# Patient Record
Sex: Male | Born: 1950
Health system: Southern US, Community
[De-identification: ages and names within clinical notes are randomized; demographics above are authoritative.]

## PROBLEM LIST (undated history)

## (undated) DIAGNOSIS — E785 Hyperlipidemia, unspecified: Secondary | ICD-10-CM

## (undated) DIAGNOSIS — Z8719 Personal history of other diseases of the digestive system: Secondary | ICD-10-CM

## (undated) DIAGNOSIS — I10 Essential (primary) hypertension: Secondary | ICD-10-CM

## (undated) DIAGNOSIS — N289 Disorder of kidney and ureter, unspecified: Secondary | ICD-10-CM

## (undated) DIAGNOSIS — I714 Abdominal aortic aneurysm, without rupture, unspecified: Secondary | ICD-10-CM

## (undated) DIAGNOSIS — Z8619 Personal history of other infectious and parasitic diseases: Secondary | ICD-10-CM

## (undated) DIAGNOSIS — Z87828 Personal history of other (healed) physical injury and trauma: Secondary | ICD-10-CM

## (undated) DIAGNOSIS — Z87442 Personal history of urinary calculi: Secondary | ICD-10-CM

## (undated) DIAGNOSIS — Z87891 Personal history of nicotine dependence: Secondary | ICD-10-CM

## (undated) HISTORY — DX: Disorder of kidney and ureter, unspecified: N28.9

## (undated) HISTORY — PX: BACK SURGERY: SHX140

## (undated) HISTORY — DX: Abdominal aortic aneurysm, without rupture: I71.4

## (undated) HISTORY — DX: Essential (primary) hypertension: I10

## (undated) HISTORY — DX: Abdominal aortic aneurysm, without rupture, unspecified: I71.40

## (undated) HISTORY — DX: Personal history of urinary calculi: Z87.442

## (undated) HISTORY — DX: Personal history of other infectious and parasitic diseases: Z86.19

## (undated) HISTORY — DX: Hyperlipidemia, unspecified: E78.5

## (undated) HISTORY — PX: STAPEDECTOMY: SHX2435

## (undated) HISTORY — DX: Personal history of other (healed) physical injury and trauma: Z87.828

## (undated) HISTORY — DX: Personal history of nicotine dependence: Z87.891

## (undated) HISTORY — DX: Personal history of other diseases of the digestive system: Z87.19

---

## 2002-06-03 ENCOUNTER — Ambulatory Visit (HOSPITAL_COMMUNITY): Admission: RE | Admit: 2002-06-03 | Discharge: 2002-06-03 | Payer: Self-pay | Admitting: Internal Medicine

## 2002-06-03 ENCOUNTER — Encounter: Payer: Self-pay | Admitting: Internal Medicine

## 2002-06-05 ENCOUNTER — Ambulatory Visit (HOSPITAL_COMMUNITY): Admission: RE | Admit: 2002-06-05 | Discharge: 2002-06-05 | Payer: Self-pay | Admitting: Family Medicine

## 2003-08-30 ENCOUNTER — Inpatient Hospital Stay (HOSPITAL_COMMUNITY): Admission: EM | Admit: 2003-08-30 | Discharge: 2003-09-02 | Payer: Self-pay | Admitting: Emergency Medicine

## 2003-09-03 ENCOUNTER — Encounter: Admission: RE | Admit: 2003-09-03 | Discharge: 2003-09-03 | Payer: Self-pay | Admitting: Family Medicine

## 2005-10-19 ENCOUNTER — Observation Stay (HOSPITAL_COMMUNITY): Admission: EM | Admit: 2005-10-19 | Discharge: 2005-10-20 | Payer: Self-pay | Admitting: Emergency Medicine

## 2008-11-20 ENCOUNTER — Ambulatory Visit: Payer: Self-pay | Admitting: Cardiothoracic Surgery

## 2008-11-20 ENCOUNTER — Encounter: Payer: Self-pay | Admitting: Cardiothoracic Surgery

## 2008-11-20 ENCOUNTER — Encounter: Payer: Self-pay | Admitting: Internal Medicine

## 2008-11-20 ENCOUNTER — Ambulatory Visit: Payer: Self-pay | Admitting: Cardiology

## 2008-11-20 ENCOUNTER — Inpatient Hospital Stay (HOSPITAL_COMMUNITY): Admission: EM | Admit: 2008-11-20 | Discharge: 2008-11-27 | Payer: Self-pay | Admitting: Emergency Medicine

## 2008-11-21 HISTORY — PX: CORONARY ARTERY BYPASS GRAFT: SHX141

## 2008-12-03 ENCOUNTER — Encounter: Payer: Self-pay | Admitting: Internal Medicine

## 2008-12-12 DIAGNOSIS — Z951 Presence of aortocoronary bypass graft: Secondary | ICD-10-CM | POA: Insufficient documentation

## 2008-12-12 DIAGNOSIS — E119 Type 2 diabetes mellitus without complications: Secondary | ICD-10-CM | POA: Insufficient documentation

## 2008-12-12 DIAGNOSIS — M109 Gout, unspecified: Secondary | ICD-10-CM | POA: Insufficient documentation

## 2008-12-12 DIAGNOSIS — E1142 Type 2 diabetes mellitus with diabetic polyneuropathy: Secondary | ICD-10-CM | POA: Insufficient documentation

## 2008-12-12 DIAGNOSIS — I10 Essential (primary) hypertension: Secondary | ICD-10-CM | POA: Insufficient documentation

## 2008-12-12 DIAGNOSIS — Z87442 Personal history of urinary calculi: Secondary | ICD-10-CM | POA: Insufficient documentation

## 2008-12-12 DIAGNOSIS — E781 Pure hyperglyceridemia: Secondary | ICD-10-CM | POA: Insufficient documentation

## 2008-12-12 DIAGNOSIS — N189 Chronic kidney disease, unspecified: Secondary | ICD-10-CM | POA: Insufficient documentation

## 2008-12-19 ENCOUNTER — Encounter: Admission: RE | Admit: 2008-12-19 | Discharge: 2008-12-19 | Payer: Self-pay | Admitting: Cardiothoracic Surgery

## 2008-12-19 ENCOUNTER — Ambulatory Visit: Payer: Self-pay | Admitting: Cardiothoracic Surgery

## 2008-12-22 ENCOUNTER — Ambulatory Visit: Payer: Self-pay | Admitting: Surgery

## 2008-12-29 ENCOUNTER — Encounter: Payer: Self-pay | Admitting: Internal Medicine

## 2009-01-20 ENCOUNTER — Telehealth: Payer: Self-pay | Admitting: Internal Medicine

## 2009-02-04 ENCOUNTER — Ambulatory Visit: Payer: Self-pay | Admitting: Internal Medicine

## 2009-02-04 DIAGNOSIS — I714 Abdominal aortic aneurysm, without rupture, unspecified: Secondary | ICD-10-CM | POA: Insufficient documentation

## 2009-02-04 DIAGNOSIS — I2510 Atherosclerotic heart disease of native coronary artery without angina pectoris: Secondary | ICD-10-CM | POA: Insufficient documentation

## 2009-02-04 DIAGNOSIS — I10 Essential (primary) hypertension: Secondary | ICD-10-CM | POA: Insufficient documentation

## 2009-02-10 ENCOUNTER — Telehealth: Payer: Self-pay | Admitting: Internal Medicine

## 2009-06-23 ENCOUNTER — Encounter: Payer: Self-pay | Admitting: Internal Medicine

## 2009-06-29 ENCOUNTER — Telehealth: Payer: Self-pay | Admitting: Internal Medicine

## 2009-12-15 ENCOUNTER — Encounter: Payer: Self-pay | Admitting: Internal Medicine

## 2010-02-10 ENCOUNTER — Ambulatory Visit: Payer: Self-pay | Admitting: Internal Medicine

## 2010-02-10 DIAGNOSIS — R079 Chest pain, unspecified: Secondary | ICD-10-CM | POA: Insufficient documentation

## 2010-02-16 ENCOUNTER — Telehealth (INDEPENDENT_AMBULATORY_CARE_PROVIDER_SITE_OTHER): Payer: Self-pay | Admitting: *Deleted

## 2010-02-17 ENCOUNTER — Encounter: Payer: Self-pay | Admitting: Cardiovascular Disease

## 2010-02-17 ENCOUNTER — Ambulatory Visit: Payer: Self-pay | Admitting: Cardiovascular Disease

## 2010-02-17 ENCOUNTER — Ambulatory Visit: Payer: Self-pay

## 2010-02-17 ENCOUNTER — Encounter (HOSPITAL_COMMUNITY): Admission: RE | Admit: 2010-02-17 | Discharge: 2010-03-08 | Payer: Self-pay | Admitting: Internal Medicine

## 2010-02-18 ENCOUNTER — Encounter: Payer: Self-pay | Admitting: Cardiovascular Disease

## 2010-07-13 NOTE — Assessment & Plan Note (Signed)
Summary: Cardiology Nuclear Testing  Nuclear Med Background Indications for Stress Test: Evaluation for Ischemia, Graft Patency, Abnormal EKG   History: CABG, Echo, Heart Catheterization, Myocardial Infarction  History Comments: 6/10 CABG 06/10 Echo nl 06/10 MI-NSTEMI AAA 3cm  Symptoms: Chest Pain, Chest Pain with Exertion, Palpitations  Symptoms Comments: CP down L arm   Nuclear Pre-Procedure Cardiac Risk Factors: Hypertension, Lipids, NIDDM Caffeine/Decaff Intake: none NPO After: 5:30 PM Lungs: clear IV 0.9% NS with Angio Cath: 22g     IV Site: R Antecubital IV Started by: Bonnita Levan, RN Chest Size (in) 46     Height (in): 69 Weight (lb): 210 BMI: 31.12  Nuclear Med Study 1 or 2 day study:  1 day     Stress Test Type:  Stress Reading MD:  Charlton Haws, MD     Referring MD:  D.Bensimhon Resting Radionuclide:  Technetium 64m Tetrofosmin     Resting Radionuclide Dose:  11.0 mCi  Stress Radionuclide:  Technetium 52m Tetrofosmin     Stress Radionuclide Dose:  33.0 mCi   Stress Protocol Exercise Time (min):  6:15 min     Max HR:  150 bpm     Predicted Max HR:  160 bpm  Max Systolic BP: 188 mm Hg     Percent Max HR:  93.75 %     METS: 7.3 Rate Pressure Product:  16109    Stress Test Technologist:  Milana Na, EMT-P     Nuclear Technologist:  Doyne Keel, CNMT  Rest Procedure  Myocardial perfusion imaging was performed at rest 45 minutes following the intravenous administration of Technetium 63m Tetrofosmin.  Stress Procedure  The patient exercised for 6:15. The patient stopped due to fatigue and denied any chest pain.  There were non specific ST-T wave changes and a rare pvc.  Technetium 9m Tetrofosmin was injected at peak exercise and myocardial perfusion imaging was performed after a brief delay.  QPS Raw Data Images:  motion artifcat especially on stress images Stress Images:  Normal homogeneous uptake in all areas of the myocardium. Rest Images:  Normal  homogeneous uptake in all areas of the myocardium. Subtraction (SDS):  SDS 0 Transient Ischemic Dilatation:  0.98  (Normal <1.22)  Lung/Heart Ratio:  0.37  (Normal <0.45)  Quantitative Gated Spect Images QGS EDV:  88 ml QGS ESV:  34 ml QGS EF:  61 % QGS cine images:  normal  Findings Low risk nuclear study  Evidence for inferior infarct     Overall Impression  Exercise Capacity: Fair exercise capacity. BP Response: Normal blood pressure response. Clinical Symptoms: No chest pain ECG Impression: Flattening of the ST segments laterally but not diagnostic Overall Impression: [place] small inferobasal infarct.  No ischemia Low risk study  Appended Document: Cardiology Nuclear Testing no ischemia. continue medical rx.  Appended Document: Cardiology Nuclear Testing Left message to call back   Appended Document: Cardiology Nuclear Testing pt aware

## 2010-07-13 NOTE — Assessment & Plan Note (Signed)
Summary: f1y/appt time 1:30 ok per Gesila/lg   Visit Type:  Follow-up Primary Provider:  Elgie Congo  CC:  stinging in lower chest - left arm tightness.  History of Present Illness: [name] is 60 y/o male with h/o DM2, HTN, HL and tobacco use admitted in June 2010 with NSTEMI. Found to have severe CAD and small AAA on cath an underwent CABG June 2010. Returns to day for routine f/u.  Unable to tolerate ace-inhibitors at all due to severe hypotension and renal insufficiency.  Saw Dr. Myra Gianotti for AAA and it was only 3cm.  Recent lipids TC 126 TG 360 HDL 31 LDL 23 . PCP started on fish oil 3g.   Returns today for routine f/u. Feeling pretty good. Walks if it is not too hot. Gets occasional sharp CP in epigastrum. 1-2x/day. Transient. No persistent pain. Also has pain in L arm from elbow to wrist. Prior to MI had similar pain but in upper arm. Not worse with exertion. Compliant with medicines.  Taking simva and red yeast rice.  Business bad. Having very hard with finances. Now on full disability.     Current Medications (verified): 1)  Metoprolol Tartrate 50 Mg Tabs (Metoprolol Tartrate) .... Take One Tablet By Mouth Twice A Day 2)  Simvastatin 40 Mg Tabs (Simvastatin) .... Take One Tablet By Mouth Daily At Bedtime 3)  Metformin Hcl 500 Mg Xr24h-Tab (Metformin Hcl) .... Take 2 Tablet Two Times A Day 4)  Aspirin 325 Mg Tabs (Aspirin) .... Once Daily 5)  Centrum Silver  Tabs (Multiple Vitamins-Minerals) .... Once Daily 6)  Eql Fish Oil 1000 Mg Caps (Omega-3 Fatty Acids) .... Take One Capt Wo Times A Day 7)  Indomethacin 50 Mg Caps (Indomethacin) .... As Needed 8)  Naproxen 500 Mg Tabs (Naproxen) .... As Needed 9)  Red Yeast Rice Extract 600 Mg Caps (Red Yeast Rice Extract) .... Take One Capsule Two Times A Day  Allergies: 1)  ! * Statin 2)  ! Ace Inhibitors 3)  ! * Colchine  Past History:  Past Medical History: Last updated: 02/04/2009 Current Problems:  CORONARY ARTERY  --s/p NSTEMI 6/10    --s/p BYPASS GRAFT, FOUR VESSEL, 6/10 Abdominal Aortic Aneurysm Tobacco use Hypertension HYPERTRIGLYCERIDEMIA (ICD-272.1) HYPERLIPIDEMIA (ICD-272.4) DIABETES MELLITUS, TYPE II (ICD-250.00) NEPHROLITHIASIS, HX OF (ICD-V13.01) GOUT (ICD-274.9)  Review of Systems       As per HPI and past medical history; otherwise all systems negative.   Vital Signs:  Patient profile:   60 year old male Height:      69 inches Weight:      210 pounds BMI:     31.12 Pulse rate:   85 / minute Pulse rhythm:   regular BP sitting:   118 / 70  (right arm) Cuff size:   regular  Vitals Entered By: Hardin Negus, RMA (February 10, 2010 1:24 PM)  Physical Exam  General:  well appearing no resp difficulty HEENT: normal Neck: supple. no JVD. Carotids 2+ bilat; no bruits. No lymphadenopathy or thryomegaly appreciated. Cor: PMI nondisplaced. Regular rate & rhythm. No rubs, gallops, murmur. CABG scar doing well Lungs: clear Abdomen: obese. soft, nontender, nondistended. Peri Jefferson bowel sounds. Extremities: no cyanosis, clubbing, rash, edema Neuro: alert & orientedx3, cranial nerves grossly intact. moves all 4 extremities w/o difficulty. affect pleasant    Impression & Recommendations:  Problem # 1:  CAD, NATIVE VESSEL (ICD-414.01) I am concerned that arm pain may be early angina. Given abnormal ECG will plane ETT/myoview to evaluate. If  signs worse knows to call us and would need cath.  Problem # 2:  HYPERTRIGLYCERIDEMIA (ICD-272.1) Lipids followed by Dr. Cleta Alberts. Stop Red Yeast and just continue simva. Increase fish oil to 2g two times a day.   Other Orders: EKG w/ Interpretation (93000) Nuclear Stress Test (Nuc Stress Test)  Patient Instructions: 1)  Increase Fish Oil to 2000mg  two times a day  2)  Your physician has requested that you have an exercise stress myoview.  For further information please visit [email or web address]  Please follow instruction sheet, as given. 3)  Your  physician wants you to follow-up in:  6 months.  You will receive a reminder letter in the mail two months in advance. If you don't receive a letter, please call our office to schedule the follow-up appointment.

## 2010-07-13 NOTE — Progress Notes (Signed)
Summary: **[name]** cost of medication   Phone Note Call from Patient Call back at [facility] Phone [phone]   Caller: Patient Reason for Call: Talk to Nurse Details for Reason: wants to discuss the cost of medication is $487.00. for one bottle. doesn't know the name of medication .  Initial call taken by: Lorne Skeens,  June 29, 2009 2:09 PM  Follow-up for Phone Call        S/W [name] and she had already s/w the pt's pharmacy and faxed a new RX for  Fenofibrate 134mg  daily. S/W pt and he had not checked with his pharmacy before he called me. He was unaware of the change.  Follow-up by: Duncan Dull, RN, BSN,  June 29, 2009 2:42 PM     Appended Document: **[name]** cost of medication  thanks.

## 2010-07-13 NOTE — Progress Notes (Signed)
Summary: nuc pre procedure  Phone Note Outgoing Call Call back at Home Phone [phone]   Call placed by: Darrick Penna Call placed to: Patient Reason for Call: Confirm/change Appt Summary of Call: Reviewed information on Myoview Information Sheet (see scanned document for further details).  Spoke with patient.      Nuclear Med Background Indications for Stress Test: Evaluation for Ischemia, Graft Patency, Abnormal EKG   History: CABG, Echo, Heart Catheterization, Myocardial Infarction  History Comments: 6/10 CABG 06/10 Echo nl 06/10 MI-NSTEMI AAA 3cm  Symptoms: Chest Pain, Chest Pain with Exertion  Symptoms Comments: CP down L arm   Nuclear Pre-Procedure Cardiac Risk Factors: Hypertension, Lipids, NIDDM Height (in): 69

## 2010-08-05 ENCOUNTER — Encounter: Payer: Self-pay | Admitting: Internal Medicine

## 2010-08-05 ENCOUNTER — Ambulatory Visit (INDEPENDENT_AMBULATORY_CARE_PROVIDER_SITE_OTHER): Payer: Self-pay | Admitting: Internal Medicine

## 2010-08-05 DIAGNOSIS — I2510 Atherosclerotic heart disease of native coronary artery without angina pectoris: Secondary | ICD-10-CM

## 2010-08-10 NOTE — Assessment & Plan Note (Signed)
Summary: per check out/sf   Visit Type:  Follow-up Primary Provider:  Elgie Congo  CC:  no complaints.  History of Present Illness: [name] is 60 y/o male with h/o DM2, HTN, HL and tobacco use admitted in June 2010 with NSTEMI. Found to have severe CAD and small AAA on cath an underwent CABG June 2010. Returns to day for routine f/u. Saw Dr. Myra Gianotti for AAA and it was only 3cm.  Unable to tolerate ace-inhibitors at all due to severe hypotension and renal insufficiency.  Had Myoview 9/11 for arm pain EF 61% inferior infarct. no ischemia. Cost him $7k and he is still payin git off.   Returns today for routine f/u. Feeling pretty good. Not waliking regularly. No CP or more arm pain.  Compliant with medicines.  Taking simva and Krill oil (like concentrated fish oil). Still smoking 1 PPD. Due to see PCP and gets lab checked next month.   Having very hard with finances. Now on full disability.     Current Medications (verified): 1)  Metoprolol Tartrate 50 Mg Tabs (Metoprolol Tartrate) .... Take One Tablet By Mouth Twice A Day 2)  Simvastatin 40 Mg Tabs (Simvastatin) .... Take One Tablet By Mouth Daily At Bedtime 3)  Metformin Hcl 500 Mg Xr24h-Tab (Metformin Hcl) .... Take 2 Tablet Two Times A Day 4)  Aspirin 325 Mg Tabs (Aspirin) .... Once Daily 5)  Centrum Silver  Tabs (Multiple Vitamins-Minerals) .... Once Daily 6)  Ra Krill Oil 500 Mg Caps (Krill Oil) .... One Tablet Twice A Day 7)  Phospholipos 167mg  .... Daily  Allergies (verified): 1)  ! * Statin 2)  ! Ace Inhibitors 3)  ! * Colchine  Past History:  Past Medical History: Last updated: 02/04/2009 Current Problems:  CORONARY ARTERY    --s/p NSTEMI 6/10    --s/p BYPASS GRAFT, FOUR VESSEL, 6/10 Abdominal Aortic Aneurysm Tobacco use Hypertension HYPERTRIGLYCERIDEMIA (ICD-272.1) HYPERLIPIDEMIA (ICD-272.4) DIABETES MELLITUS, TYPE II (ICD-250.00) NEPHROLITHIASIS, HX OF (ICD-V13.01) GOUT (ICD-274.9)  Review of Systems      As per HPI and past medical history; otherwise all systems negative.   Vital Signs:  Patient profile:   60 year old male Height:      69 inches Weight:      214.25 pounds BMI:     31.75 Pulse rate:   80 / minute BP sitting:   122 / 72  (right arm) Cuff size:   regular  Vitals Entered By: Micki Riley CNA (August 05, 2010 10:19 AM)  Physical Exam  General:  well appearing no resp difficulty HEENT: normal Neck: supple. no JVD. Carotids 2+ bilat; no bruits. No lymphadenopathy or thryomegaly appreciated. Cor: PMI nondisplaced. Regular rate & rhythm. No rubs, gallops, murmur. CABG scar doing well Lungs: clear Abdomen: obese. soft, nontender, nondistended. Peri Jefferson bowel sounds. Extremities: no cyanosis, clubbing, rash, edema Neuro: alert & orientedx3, cranial nerves grossly intact. moves all 4 extremities w/o difficulty. affect pleasant    Impression & Recommendations:  Problem # 1:  CAD, NATIVE VESSEL (ICD-414.01) Doing well. Recent Myoview reassuring.   Problem # 2:  AAA (ICD-441.4) Small. Followed by [facility].  Problem # 3:  HYPERTENSION, BENIGN (ICD-401.1) Blood pressure well controlled. Continue current regimen.  Problem # 4:  HYPERLIPIDEMIA (ICD-272.4) Followed by PCP. Goal LDL < 70. Continue statin.   Problem # 5:  TOBACCO USE Counseled on need to quit.   Patient Instructions: 1)  Your physician wants you to follow-up in:  1 year.  You will receive  a reminder letter in the mail two months in advance. If you don't receive a letter, please call our office to schedule the follow-up appointment. Prescriptions: SIMVASTATIN 40 MG TABS (SIMVASTATIN) Take one tablet by mouth daily at bedtime  #90 Tablet x 3   Entered by:   Meredith Staggers, RN   Authorized by:   Dolores Patty, MD, [facility]   Signed by:   Meredith Staggers, RN on 08/05/2010   Method used:   Faxed to ...       [facility] [phone] (retail)       [address]       [place], [place]  [address]       Ph:  [phone]       Fax: [phone]   RxID:   [id number] METFORMIN HCL 500 MG XR24H-TAB (METFORMIN HCL) take 2 tablet two times a day  #360 x 0   Entered by:   Meredith Staggers, RN   Authorized by:   Dolores Patty, MD, [facility]   Signed by:   Meredith Staggers, RN on 08/05/2010   Method used:   Electronically to        [facility]  [phone]* (retail)       [address]       [place], [place]  [address]       Ph: [phone] or [phone]       Fax: [phone]   RxID:   [id number] METOPROLOL TARTRATE 50 MG TABS (METOPROLOL TARTRATE) Take one tablet by mouth twice a day  #180 Each x 3   Entered by:   Meredith Staggers, RN   Authorized by:   Dolores Patty, MD, [facility]   Signed by:   Meredith Staggers, RN on 08/05/2010   Method used:   Electronically to        [facility]  [phone]* (retail)       [address]       [place], [place]  [address]       Ph: [phone] or [phone]       Fax: [phone]   RxID:   [id number]

## 2010-08-24 ENCOUNTER — Encounter: Payer: Self-pay | Admitting: Internal Medicine

## 2010-08-24 LAB — CONVERTED CEMR LAB: HDL: 26 mg/dL

## 2010-08-26 ENCOUNTER — Encounter: Payer: Self-pay | Admitting: Internal Medicine

## 2010-09-08 ENCOUNTER — Telehealth: Payer: Self-pay | Admitting: Internal Medicine

## 2010-09-08 NOTE — Telephone Encounter (Signed)
Spoke w/pt, money is an issue, he has no insurance, curious about pricing, will call [facility] and check prices on both fenofibrate and Crestor and get back to him, could probably get pt into assistance program for crestor

## 2010-09-10 MED ORDER — OMEGA-3 FATTY ACIDS 1000 MG PO CAPS: ORAL_CAPSULE | ORAL | Status: DC

## 2010-09-10 MED ORDER — ROSUVASTATIN CALCIUM 40 MG PO TABS: 40.0000 mg | ORAL_TABLET | Freq: Every day | ORAL | Status: DC

## 2010-09-10 NOTE — Telephone Encounter (Signed)
Per Dr Gala Romney pt is going to start Fish Oil 3gms bid and Crestor, he is going to apply for Crestor pt asst. Program, I have started the paperwork, he will come by [facility]. To sign

## 2010-09-20 LAB — CREATININE, SERUM
Creatinine, Ser: 0.6 mg/dL (ref 0.4–1.5)
Creatinine, Ser: 0.7 mg/dL (ref 0.4–1.5)
GFR calc Af Amer: >60 mL/min (ref >60)
GFR calc Af Amer: >60 mL/min (ref >60)
GFR calc non Af Amer: >60 mL/min (ref >60)
GFR calc non Af Amer: >60 mL/min (ref >60)

## 2010-09-20 LAB — POCT I-STAT 3, ART BLOOD GAS (G3+)
Acid-base deficit: 3 mmol/L — ABNORMAL HIGH (ref 0.0–2.0)
Acid-base deficit: 3 mmol/L — ABNORMAL HIGH (ref 0.0–2.0)
Acid-base deficit: 4 mmol/L — ABNORMAL HIGH (ref 0.0–2.0)
Bicarbonate: 19.8 mEq/L — ABNORMAL LOW (ref 20.0–24.0)
Bicarbonate: 20.5 mEq/L (ref 20.0–24.0)
Bicarbonate: 20.9 mEq/L (ref 20.0–24.0)
Bicarbonate: 21 mEq/L (ref 20.0–24.0)
Bicarbonate: 21.7 mEq/L (ref 20.0–24.0)
Bicarbonate: 21.9 mEq/L (ref 20.0–24.0)
Bicarbonate: 22.8 mEq/L (ref 20.0–24.0)
O2 Saturation: 100 %
O2 Saturation: 90 %
O2 Saturation: 91 %
O2 Saturation: 94 %
O2 Saturation: 94 %
Patient temperature: 37.2
Patient temperature: 37.7
Patient temperature: 38.1
TCO2: 21 mmol/L (ref 0–100)
TCO2: 22 mmol/L (ref 0–100)
TCO2: 23 mmol/L (ref 0–100)
TCO2: 24 mmol/L (ref 0–100)
TCO2: 26 mmol/L (ref 0–100)
pCO2 arterial: 30.8 mmHg — ABNORMAL LOW (ref 35.0–45.0)
pCO2 arterial: 32.2 mmHg — ABNORMAL LOW (ref 35.0–45.0)
pCO2 arterial: 33.2 mmHg — ABNORMAL LOW (ref 35.0–45.0)
pCO2 arterial: 35.2 mmHg (ref 35.0–45.0)
pCO2 arterial: 38.4 mmHg (ref 35.0–45.0)
pCO2 arterial: 39.1 mmHg (ref 35.0–45.0)
pCO2 arterial: 41.9 mmHg (ref 35.0–45.0)
pCO2 arterial: 42.2 mmHg (ref 35.0–45.0)
pCO2 arterial: 43.6 mmHg (ref 35.0–45.0)
pCO2 arterial: 44.7 mmHg (ref 35.0–45.0)
pCO2 arterial: 45.9 mmHg — ABNORMAL HIGH (ref 35.0–45.0)
pH, Arterial: 7.31 — ABNORMAL LOW (ref 7.350–7.450)
pH, Arterial: 7.316 — ABNORMAL LOW (ref 7.350–7.450)
pH, Arterial: 7.328 — ABNORMAL LOW (ref 7.350–7.450)
pH, Arterial: 7.34 — ABNORMAL LOW (ref 7.350–7.450)
pH, Arterial: 7.348 — ABNORMAL LOW (ref 7.350–7.450)
pH, Arterial: 7.376 (ref 7.350–7.450)
pH, Arterial: 7.386 (ref 7.350–7.450)
pH, Arterial: 7.412 (ref 7.350–7.450)
pH, Arterial: 7.413 (ref 7.350–7.450)
pH, Arterial: 7.421 (ref 7.350–7.450)
pO2, Arterial: 141 mmHg — ABNORMAL HIGH (ref 80.0–100.0)
pO2, Arterial: 252 mmHg — ABNORMAL HIGH (ref 80.0–100.0)
pO2, Arterial: 58 mmHg — ABNORMAL LOW (ref 80.0–100.0)
pO2, Arterial: 67 mmHg — ABNORMAL LOW (ref 80.0–100.0)
pO2, Arterial: 69 mmHg — ABNORMAL LOW (ref 80.0–100.0)
pO2, Arterial: 74 mmHg — ABNORMAL LOW (ref 80.0–100.0)

## 2010-09-20 LAB — BASIC METABOLIC PANEL
BUN: 10 mg/dL (ref 6–23)
BUN: 7 mg/dL (ref 6–23)
CO2: 28 mEq/L (ref 19–32)
CO2: 24 mEq/L (ref 19–32)
CO2: 27 mEq/L (ref 19–32)
CO2: 27 mEq/L (ref 19–32)
Calcium: 9.2 mg/dL (ref 8.4–10.5)
Calcium: 7.8 mg/dL — ABNORMAL LOW (ref 8.4–10.5)
Calcium: 8.6 mg/dL (ref 8.4–10.5)
Calcium: 8.7 mg/dL (ref 8.4–10.5)
Chloride: 107 mEq/L (ref 96–112)
Chloride: 105 mEq/L (ref 96–112)
Chloride: 106 mEq/L (ref 96–112)
Chloride: 109 mEq/L (ref 96–112)
Creatinine, Ser: 0.71 mg/dL (ref 0.4–1.5)
Creatinine, Ser: 0.7 mg/dL (ref 0.4–1.5)
GFR calc Af Amer: >60 mL/min (ref >60)
GFR calc Af Amer: >60 mL/min (ref >60)
GFR calc Af Amer: >60 mL/min (ref >60)
GFR calc Af Amer: >60 mL/min (ref >60)
GFR calc Af Amer: >60 mL/min (ref >60)
GFR calc non Af Amer: >60 mL/min (ref >60)
GFR calc non Af Amer: >60 mL/min (ref >60)
GFR calc non Af Amer: >60 mL/min (ref >60)
GFR calc non Af Amer: >60 mL/min (ref >60)
Glucose, Bld: 171 mg/dL — ABNORMAL HIGH (ref 70–99)
Glucose, Bld: 173 mg/dL — ABNORMAL HIGH (ref 70–99)
Potassium: 4.4 mEq/L (ref 3.5–5.1)
Potassium: 3.6 mEq/L (ref 3.5–5.1)
Potassium: 3.7 mEq/L (ref 3.5–5.1)
Potassium: 3.8 mEq/L (ref 3.5–5.1)
Sodium: 141 mEq/L (ref 135–145)
Sodium: 137 mEq/L (ref 135–145)
Sodium: 138 mEq/L (ref 135–145)
Sodium: 138 mEq/L (ref 135–145)
Sodium: 138 mEq/L (ref 135–145)

## 2010-09-20 LAB — GLUCOSE, CAPILLARY
Glucose-Capillary: 134 mg/dL — ABNORMAL HIGH (ref 70–99)
Glucose-Capillary: 139 mg/dL — ABNORMAL HIGH (ref 70–99)
Glucose-Capillary: 172 mg/dL — ABNORMAL HIGH (ref 70–99)
Glucose-Capillary: 105 mg/dL — ABNORMAL HIGH (ref 70–99)
Glucose-Capillary: 108 mg/dL — ABNORMAL HIGH (ref 70–99)
Glucose-Capillary: 111 mg/dL — ABNORMAL HIGH (ref 70–99)
Glucose-Capillary: 126 mg/dL — ABNORMAL HIGH (ref 70–99)
Glucose-Capillary: 126 mg/dL — ABNORMAL HIGH (ref 70–99)
Glucose-Capillary: 127 mg/dL — ABNORMAL HIGH (ref 70–99)
Glucose-Capillary: 132 mg/dL — ABNORMAL HIGH (ref 70–99)
Glucose-Capillary: 132 mg/dL — ABNORMAL HIGH (ref 70–99)
Glucose-Capillary: 148 mg/dL — ABNORMAL HIGH (ref 70–99)
Glucose-Capillary: 156 mg/dL — ABNORMAL HIGH (ref 70–99)
Glucose-Capillary: 170 mg/dL — ABNORMAL HIGH (ref 70–99)
Glucose-Capillary: 177 mg/dL — ABNORMAL HIGH (ref 70–99)
Glucose-Capillary: 194 mg/dL — ABNORMAL HIGH (ref 70–99)
Glucose-Capillary: 201 mg/dL — ABNORMAL HIGH (ref 70–99)
Glucose-Capillary: 221 mg/dL — ABNORMAL HIGH (ref 70–99)
Glucose-Capillary: 236 mg/dL — ABNORMAL HIGH (ref 70–99)

## 2010-09-20 LAB — URINALYSIS, ROUTINE W REFLEX MICROSCOPIC
Bilirubin Urine: NEGATIVE
Hgb urine dipstick: NEGATIVE
Protein, ur: NEGATIVE mg/dL
Specific Gravity, Urine: 1.015 (ref 1.005–1.030)
Urobilinogen, UA: 0.2 mg/dL (ref 0.0–1.0)

## 2010-09-20 LAB — CBC
HCT: 29 % — ABNORMAL LOW (ref 39.0–52.0)
HCT: 30.3 % — ABNORMAL LOW (ref 39.0–52.0)
HCT: 30.5 % — ABNORMAL LOW (ref 39.0–52.0)
HCT: 33.5 % — ABNORMAL LOW (ref 39.0–52.0)
HCT: 34.3 % — ABNORMAL LOW (ref 39.0–52.0)
Hemoglobin: 10.3 g/dL — ABNORMAL LOW (ref 13.0–17.0)
Hemoglobin: 10.4 g/dL — ABNORMAL LOW (ref 13.0–17.0)
Hemoglobin: 10.6 g/dL — ABNORMAL LOW (ref 13.0–17.0)
Hemoglobin: 11 g/dL — ABNORMAL LOW (ref 13.0–17.0)
Hemoglobin: 11.6 g/dL — ABNORMAL LOW (ref 13.0–17.0)
Hemoglobin: 11.9 g/dL — ABNORMAL LOW (ref 13.0–17.0)
MCHC: 34.2 g/dL (ref 30.0–36.0)
MCHC: 34.5 g/dL (ref 30.0–36.0)
MCHC: 34.5 g/dL (ref 30.0–36.0)
MCHC: 34.6 g/dL (ref 30.0–36.0)
MCHC: 34.7 g/dL (ref 30.0–36.0)
MCHC: 34.8 g/dL (ref 30.0–36.0)
MCV: 85.1 fL (ref 78.0–100.0)
MCV: 85.6 fL (ref 78.0–100.0)
MCV: 85.8 fL (ref 78.0–100.0)
MCV: 85.9 fL (ref 78.0–100.0)
MCV: 86.1 fL (ref 78.0–100.0)
MCV: 86.6 fL (ref 78.0–100.0)
Platelets: 108 10*3/uL — ABNORMAL LOW (ref 150–400)
Platelets: 114 10*3/uL — ABNORMAL LOW (ref 150–400)
Platelets: 126 10*3/uL — ABNORMAL LOW (ref 150–400)
Platelets: 138 10*3/uL — ABNORMAL LOW (ref 150–400)
Platelets: 171 10*3/uL (ref 150–400)
Platelets: 197 10*3/uL (ref 150–400)
RBC: 3.38 MIL/uL — ABNORMAL LOW (ref 4.22–5.81)
RBC: 3.47 MIL/uL — ABNORMAL LOW (ref 4.22–5.81)
RBC: 3.5 MIL/uL — ABNORMAL LOW (ref 4.22–5.81)
RBC: 3.55 MIL/uL — ABNORMAL LOW (ref 4.22–5.81)
RBC: 3.62 MIL/uL — ABNORMAL LOW (ref 4.22–5.81)
RBC: 3.9 MIL/uL — ABNORMAL LOW (ref 4.22–5.81)
RBC: 4.01 MIL/uL — ABNORMAL LOW (ref 4.22–5.81)
RDW: 13.1 % (ref 11.5–15.5)
RDW: 13.2 % (ref 11.5–15.5)
RDW: 13.3 % (ref 11.5–15.5)
RDW: 13.4 % (ref 11.5–15.5)
RDW: 13.7 % (ref 11.5–15.5)
WBC: 10.5 10*3/uL (ref 4.0–10.5)
WBC: 12.3 10*3/uL — ABNORMAL HIGH (ref 4.0–10.5)
WBC: 12.6 10*3/uL — ABNORMAL HIGH (ref 4.0–10.5)
WBC: 6.8 10*3/uL (ref 4.0–10.5)
WBC: 8.6 10*3/uL (ref 4.0–10.5)
WBC: 9.9 10*3/uL (ref 4.0–10.5)

## 2010-09-20 LAB — POCT I-STAT 4, (NA,K, GLUC, HGB,HCT)
Glucose, Bld: 163 mg/dL — ABNORMAL HIGH (ref 70–99)
Glucose, Bld: 176 mg/dL — ABNORMAL HIGH (ref 70–99)
Glucose, Bld: 193 mg/dL — ABNORMAL HIGH (ref 70–99)
Glucose, Bld: 213 mg/dL — ABNORMAL HIGH (ref 70–99)
HCT: 28 % — ABNORMAL LOW (ref 39.0–52.0)
HCT: 28 % — ABNORMAL LOW (ref 39.0–52.0)
HCT: 29 % — ABNORMAL LOW (ref 39.0–52.0)
HCT: 30 % — ABNORMAL LOW (ref 39.0–52.0)
HCT: 30 % — ABNORMAL LOW (ref 39.0–52.0)
HCT: 31 % — ABNORMAL LOW (ref 39.0–52.0)
HCT: 32 % — ABNORMAL LOW (ref 39.0–52.0)
HCT: 39 % (ref 39.0–52.0)
Hemoglobin: 10.2 g/dL — ABNORMAL LOW (ref 13.0–17.0)
Hemoglobin: 10.5 g/dL — ABNORMAL LOW (ref 13.0–17.0)
Hemoglobin: 10.9 g/dL — ABNORMAL LOW (ref 13.0–17.0)
Hemoglobin: 9.5 g/dL — ABNORMAL LOW (ref 13.0–17.0)
Potassium: 4.4 mEq/L (ref 3.5–5.1)
Potassium: 4.5 mEq/L (ref 3.5–5.1)
Potassium: 5.1 mEq/L (ref 3.5–5.1)
Potassium: 5.4 mEq/L — ABNORMAL HIGH (ref 3.5–5.1)
Sodium: 135 mEq/L (ref 135–145)
Sodium: 136 mEq/L (ref 135–145)
Sodium: 142 mEq/L (ref 135–145)

## 2010-09-20 LAB — ABO/RH: ABO/RH(D): B POS

## 2010-09-20 LAB — DIFFERENTIAL
Eosinophils Absolute: 0.1 10*3/uL (ref 0.0–0.7)
Eosinophils Relative: 1 % (ref 0–5)
Lymphocytes Relative: 24 % (ref 12–46)
Lymphs Abs: 1.5 10*3/uL (ref 0.7–4.0)
Lymphs Abs: 1.6 10*3/uL (ref 0.7–4.0)
Monocytes Absolute: 0.9 10*3/uL (ref 0.1–1.0)
Monocytes Relative: 9 % (ref 3–12)
Neutro Abs: 4.3 10*3/uL (ref 1.7–7.7)
Neutrophils Relative %: 67 % (ref 43–77)

## 2010-09-20 LAB — CROSSMATCH
ABO/RH(D): B POS
Antibody Screen: NEGATIVE

## 2010-09-20 LAB — POCT I-STAT 3, VENOUS BLOOD GAS (G3P V)
Bicarbonate: 23.6 mEq/L (ref 20.0–24.0)
O2 Saturation: 73 %
TCO2: 25 mmol/L (ref 0–100)
pCO2, Ven: 48.4 mmHg (ref 45.0–50.0)
pH, Ven: 7.296 (ref 7.250–7.300)
pO2, Ven: 43 mmHg (ref 30.0–45.0)

## 2010-09-20 LAB — COMPREHENSIVE METABOLIC PANEL
ALT: 24 U/L (ref 0–53)
Alkaline Phosphatase: 93 U/L (ref 39–117)
CO2: 23 mEq/L (ref 19–32)
Chloride: 110 mEq/L (ref 96–112)
Creatinine, Ser: 0.63 mg/dL (ref 0.4–1.5)
GFR calc Af Amer: >60 mL/min (ref >60)
Glucose, Bld: 183 mg/dL — ABNORMAL HIGH (ref 70–99)
Potassium: 4.9 mEq/L (ref 3.5–5.1)
Sodium: 139 mEq/L (ref 135–145)
Total Bilirubin: 0.5 mg/dL (ref 0.3–1.2)

## 2010-09-20 LAB — POCT I-STAT, CHEM 8
BUN: 5 mg/dL — ABNORMAL LOW (ref 6–23)
Calcium, Ion: 1.19 mmol/L (ref 1.12–1.32)
Chloride: 102 mEq/L (ref 96–112)
Creatinine, Ser: 0.8 mg/dL (ref 0.4–1.5)
Glucose, Bld: 129 mg/dL — ABNORMAL HIGH (ref 70–99)
HCT: 30 % — ABNORMAL LOW (ref 39.0–52.0)
Potassium: 3.8 mEq/L (ref 3.5–5.1)
Sodium: 141 mEq/L (ref 135–145)
TCO2: 22 mmol/L (ref 0–100)

## 2010-09-20 LAB — BLOOD GAS, ARTERIAL
Acid-base deficit: 2 mmol/L (ref 0.0–2.0)
Bicarbonate: 21.8 mEq/L (ref 20.0–24.0)
FIO2: 0.21 %
O2 Saturation: 97 %
Patient temperature: 98.6
TCO2: 22.9 mmol/L (ref 0–100)
pCO2 arterial: 34.8 mmHg — ABNORMAL LOW (ref 35.0–45.0)
pH, Arterial: 7.414 (ref 7.350–7.450)
pO2, Arterial: 82.5 mmHg (ref 80.0–100.0)

## 2010-09-20 LAB — MAGNESIUM
Magnesium: 1.9 mg/dL (ref 1.5–2.5)
Magnesium: 1.9 mg/dL (ref 1.5–2.5)
Magnesium: 2.4 mg/dL (ref 1.5–2.5)

## 2010-09-20 LAB — CARDIAC PANEL(CRET KIN+CKTOT+MB+TROPI)
CK, MB: 2.1 ng/mL (ref 0.3–4.0)
Total CK: 62 U/L (ref 7–232)
Total CK: 76 U/L (ref 7–232)
Troponin I: 0.15 ng/mL — ABNORMAL HIGH (ref 0.00–0.06)

## 2010-09-20 LAB — PROTIME-INR
INR: 1.1 (ref 0.00–1.49)
INR: 1.4 (ref 0.00–1.49)
Prothrombin Time: 17.4 seconds — ABNORMAL HIGH (ref 11.6–15.2)

## 2010-09-20 LAB — APTT
aPTT: 25 seconds (ref 24–37)
aPTT: 29 seconds (ref 24–37)

## 2010-09-20 LAB — HEMOGLOBIN A1C
Hgb A1c MFr Bld: 8.6 % — ABNORMAL HIGH (ref 4.6–6.1)
Mean Plasma Glucose: 200 mg/dL

## 2010-09-20 LAB — CK TOTAL AND CKMB (NOT AT ARMC): CK, MB: 2.4 ng/mL (ref 0.3–4.0)

## 2010-09-20 LAB — LIPID PANEL: HDL: 28 mg/dL — ABNORMAL LOW (ref >39)

## 2010-09-20 LAB — TROPONIN I: Troponin I: 0.18 ng/mL — ABNORMAL HIGH (ref 0.00–0.06)

## 2010-09-27 ENCOUNTER — Telehealth: Payer: Self-pay | Admitting: Internal Medicine

## 2010-09-27 NOTE — Telephone Encounter (Signed)
Left message to call back  

## 2010-09-27 NOTE — Telephone Encounter (Signed)
Pt states heather was checking on rx meds for pt. Pt states doctor was going to change meds.

## 2010-09-29 NOTE — Telephone Encounter (Signed)
Left mess on VM pt was approved for Crestor assistance, will take about 7-10 business days to get to [place], will call him when it arrives, if he has questions to call back

## 2010-09-29 NOTE — Telephone Encounter (Signed)
Attempted to call pt, NA, have spoken w/[place] [employer] [place] [employer] and pt was approved med is in processing the pharmacy

## 2010-10-26 NOTE — Op Note (Signed)
[name], [name] NO.:  [id number]   MEDICAL RECORD NO.:  [id number]          PATIENT TYPE:  INP   LOCATION:  2314                         FACILITY:  [facility]   PHYSICIAN:  Kerin Perna, M.D.  DATE OF BIRTH:  08-20-1950   DATE OF PROCEDURE:  11/21/2008  DATE OF DISCHARGE:                               OPERATIVE REPORT   OPERATION:  1. Coronary artery bypass grafting x4 (left internal mammary artery to      left anterior descending, saphenous vein graft to ramus      intermediate, saphenous vein graft to circumflex marginal,      saphenous vein graft to distal right coronary artery).  2. Endoscopic harvest of right leg greater saphenous vein.   SURGEON:  Kerin Perna, MD   ASSISTANT:  Rowe Clack, PA-C   ANESTHESIA:  General.   PREOPERATIVE DIAGNOSES:  Unstable angina with severe left main and three-  vessel coronary artery disease.   POSTOPERATIVE DIAGNOSES:  Unstable angina with severe left main and  three-vessel coronary artery disease.   INDICATIONS:  The patient is a 60 year old ex-smoker with COPD and  symptoms of progressive and worsening chest pain consistent with  unstable angina.  He underwent urgent catheterization by Dr. Gala Romney,  which demonstrated a 90% left main stenosis with occlusion of the right  coronary and subtotal stenosis of the left coronary system.  His EF was  50% and he is felt to be candidate for surgical revascularization.  Prior to the operation, I examined the patient in the cath lab holding  area and reviewed results of the cardiac cath with the patient and his  family.  I discussed the indications and expected benefits of coronary  artery bypass surgery for treatment of his severe coronary artery  disease.  I reviewed the alternatives to surgical therapy as well.  I  discussed with the patient the major issues of the surgery including  location of surgical incisions, use of general anesthesia and  cardiopulmonary bypass, and the expected postoperative hospital  recovery.  I discussed with him the risks of coronary artery bypass  surgery including the risks of MI, CVA, bleeding, blood transfusion  requirement, infection, and death.  He understood that due to his long  history of smoking and evidence of COPD that he would be at increased  risk for pulmonary complications including prolonged ventilator  dependence, prolonged oxygen dependence, pneumonia, and pleural  effusion.  After reviewing these issues, he demonstrated his  understanding and agreed to proceed with his surgery and what I felt was  an informed consent.   OPERATIVE FINDINGS:  The patient had good mammary artery conduit.  The  coronaries were severely diseased.  The saphenous vein was somewhat  dilated and an area of focal varicosity was excluded from the conduit  selection.  The patient did not require any blood transfusion therapy.  After separation from cardiopulmonary bypass, he dropped his O2 sats,  but this improved with bronchodilator therapy and recruitment maneuvers  on the ventilator.   PROCEDURE:  The patient was brought to the operating room and placed  supine on the operating table where general anesthesia was induced.  The  chest, abdomen, and legs were prepped with Betadine and draped as a  sterile field.  A sternal incision was made as the saphenous vein was  harvested endoscopically from the right leg.  The left internal mammary  artery was harvested as a pedicle graft from its origin at the  subclavian vessels.  It was a good vessel with excellent flow.  The  sternal retractor was placed and the pericardium was opened and  suspended.  Heparin was administered and the ACT was documented as being  therapeutic.  Purse-strings were placed in the ascending aorta and right  atrium, and after the vein had been inspected and found to be adequate,  the patient was cannulated placed on bypass.  The  coronaries were  identified for grafting.  The right coronary was too small to graft  distally in the posterior descending area and it was grafted at the  distal aspect of the main right.  The ramus intermediate was small, but  graftable.  The LAD and circumflex marginal were larger vessels.  Cardioplegic catheters were placed both antegrade and retrograde cold  blood cardioplegia plegia, and the patient was cooled to 32 degrees.  The aortic cross-clamp was applied and 800 mL of cold blood cardioplegia  was delivered in split doses between the antegrade and retrograde  catheters.  There was good cardioplegic arrest and septal temperature  dropped less than 12 degrees.  Cardioplegia was then delivered every 20  minutes or less while the cross-clamp was in place.   The distal coronary anastomoses were then performed.  The first distal  anastomosis was the distal RCA.  This had some disease and was occluded  proximally.  An aortotomy was performed and the vessel was 1.5-1.7 mm.  A 1.0 probe passed distally into the posterior descending circulation.  A reverse saphenous vein was sewn end-to-side with running 7-0 Prolene  and there was good flow through the graft.  The second distal  anastomosis was the circumflex marginal branch of the left coronary.  This was a large 1.7-mm vessel with proximal 90% left main stenosis as  well as 80% stenosis of the actual vessel.  A reverse saphenous vein was  sewn end-to-side with running 7-0 Prolene with good flow through graft.  The third distal anastomosis was the ramus branch of the left coronary.  This was a smaller 1.4-mm vessel with proximal 90% left main stenosis.  Reverse saphenous vein was sewn end-to-side with running 7-0 Prolene  with good flow through graft.  Cardioplegia was redosed.  The fourth and  final distal anastomosis was the distal third to the LAD.  The LAD had  diffuse disease, but a probe passed proximally easily to the left main.   The vessel was 1.5 mm.  The left IMA pedicle was brought through an  opening, created in the left lateral pericardium, and was brought down  on the LAD and sewn end-to-side with running 8-0 Prolene.  There was  good flow through the anastomosis after briefly releasing the pedicle  bulldog on the mammary.  The bulldog was reapplied and the pedicle was  secured to the epicardium with 6-0 Prolenes.  Cardioplegia was redosed.   While the cross-clamp was still in place, 3 proximal vein anastomoses  were performed on the ascending aorta using a 4.0-mm punch running 7-0  Prolene.  Air was vented from the coronaries with a dose of retrograde  warm  blood cardioplegia and the proximal anastomosis was tied.  The  cross-clamp was removed.   Heart resumed a spontaneous rhythm.  Air was aspirated from the vein  grafts with a 27-gauge needle.  The cardioplegic catheters were removed.  The grafts were checked and each had good flow.  Hemostasis was  documented at the proximal and distal sites.  The patient was rewarmed  to 37 degrees and temporary pacing wires were applied.  The lungs were  expanded and ventilator was resumed.  The patient was then weaned from  bypass without difficulty.  Cardiac output and blood pressure were  stable on renal dose dopamine.  Protamine was administered without  adverse reaction.  The cannula was removed.  The mediastinum was  irrigated with warm antibiotic irrigation.  The leg incision was  irrigated and closed in a standard fashion.  The superior pericardial  fat was closed over the aorta.  Two mediastinal and a left pleural chest  tube were placed and brought out through  separate incisions.  The sternum was closed with interrupted steel wire.  The pectoralis fascia was closed in running #1 Vicryl.  The subcutaneous  and skin layers were closed in running Vicryl and sterile dressings were  applied.  Total bypass time was 128 minutes.      Kerin Perna, M.D.   Electronically Signed     PV/MEDQ  D:  11/22/2008  T:  11/22/2008  Job:  [id number]   cc:   Bevelyn Buckles. Bensimhon, MD

## 2010-10-26 NOTE — Procedures (Signed)
DUPLEX ULTRASOUND OF ABDOMINAL AORTA   INDICATION:  Follow-up evaluation of abdominal aortic aneurysm  previously seen by CAT scan 2 to 3 years ago and during cardiac  catheterization.   HISTORY:  Diabetes:  Yes.  Cardiac:  Coronary artery bypass graft x4 recently.  Hypertension:  No.  Smoking:  Quit recently.  Connective Tissue Disorder:  Family History:  A strong family history of ruptured and non-ruptured  aneurysms.  Previous Surgery:   DUPLEX EXAM:         AP (cm)                   TRANSVERSE (cm)  Proximal             1.8 cm                    2.1 cm  Mid                  2.5 cm                    3.0 cm  Distal               1.7 cm                    2.0 cm  Right Iliac          0.9 cm                    1.0 cm  Left Iliac           0.9 cm                    1.2 cm   PREVIOUS:  Date:  AP:  TRANSVERSE:   IMPRESSION:  Abdominal aortic aneurysm was identified, measuring 2.5 cm  anteroposterior by 3.0 cm transverse at the maximal diameter.   ___________________________________________  V. Charlena Cross, MD   MC/MEDQ  D:  12/22/2008  T:  12/22/2008  Job:  [id number]

## 2010-10-26 NOTE — Assessment & Plan Note (Signed)
OFFICE VISIT   [name], [name]  DOB:  1950-06-28                                        December 19, 2008  CHART #:  [id number]   CURRENT PROBLEMS:  1. Status post coronary artery bypass graft x4 on November 21, 2008, for      unstable angina with severe left main and three-vessel disease.  2. Hypertension.  3. Dyslipidemia.  4. Diabetes.   PRESENT ILLNESS:  The patient is a 60 year old Caucasian male, who  returns for his first office visit after undergoing multivessel coronary  artery bypass grafting in mid June.  He presented with unstable angina  and was found to have 90% left main stenosis by cath with EF of 50% and  total occlusion of right coronary.  He underwent left IMA graft to his  LAD and vein grafts to the ramus intermediate, circumflex marginal, and  right coronary artery.  The patient did well after surgery.  He was  discharged home in sinus rhythm on the fourth postoperative day.  His  hematocrit was 32.  His incisions were dry and clean.  He was discharged  home on metformin, fish oil, Zocor, Lopressor 50 b.i.d., aspirin, and  Combivent inhaler.  He has done well at home without recurrent angina  and the surgical incisions are healing well.  He is anxious to increase  his activity level and to drive.   PHYSICAL EXAMINATION:  VITAL SIGNS:  Blood pressure 112/75, pulse 80 and  regular, respirations 18, and saturation 97%.  GENERAL:  He is alert and oriented.  EXTREMITIES:  He has some numbness in the ulnar distribution and  weakness as well in his right hand.  LUNGS:  His breath sounds are clear.  CHEST:  Sternal incision is stable and well healed.  EXTREMITIES:  Leg incision is well healed.  There is no peripheral  edema.   PA and lateral chest x-ray reveals clear lung fields and a stable  cardiac silhouette and sternal wires are all intact and well aligned.   During the exam, while checking the range of motion of his upper  extremities,  he developed a vagal episode with bradycardia, sweating,  diaphoresis, and transient hypertension.  He was placed supine and given  some chocolate and his vital signs and symptoms returned to normal.   PLAN:  I told the patient after he fully recovered from his vagal  episode that he could resume driving and light activities, but not to  lift more than 20 pounds until 3 months after surgery.  He is  complaining of severe insomnia and was given a prescription for Lunesta.  He will return to his primary care physician, Dr. Perrin Maltese and to his  cardiologist.  He will return here as needed.   Kerin Perna, M.D.  Electronically Signed   PV/MEDQ  D:  12/19/2008  T:  12/20/2008  Job:  [id number]

## 2010-10-26 NOTE — Cardiovascular Report (Signed)
NAMEDEMPSY, DAMIANO NO.:  [id number]   MEDICAL RECORD NO.:  [id number]          PATIENT TYPE:  INP   LOCATION:  2914                         FACILITY:  [facility]   PHYSICIAN:  Bevelyn Buckles. Bensimhon, MDDATE OF BIRTH:  [birth year]   DATE OF PROCEDURE:  11/20/2008  DATE OF DISCHARGE:                            CARDIAC CATHETERIZATION   November 20, 2008   PRIMARY CARE PHYSICIAN:  Brett Canales A. Daub, MD   IDENTIFICATION:  Mr. Bolger is a 60 year old male with a strong family  history of cardiovascular disease with multiple relatives dying of  ruptured abdominal aortic aneurysms.  He also has a history of ongoing  tobacco use.  He was admitted with several-week history of chest pain  culminating with nocturnal angina overnight.  He was pain-free on  arrival to the cath lab.   PROCEDURES PERFORMED:  1. Selective coronary angiography.  2. Left heart catheterization.  3. Left ventriculogram.  4. Left subclavian angiography.  5. Abdominal aortogram.  6. StarClose femoral artery closure.   DESCRIPTION OF PROCEDURE:  The risks and indication were explained.  Consent was signed and placed on the chart.  A 5-French arterial sheath  was placed in the right femoral artery using a modified Seldinger  technique.  Standard catheters including a JL-4, JR-4, and angled  pigtail used for the catheterization.  All catheter exchanges were made  over the wire.  There were no apparent complications.   Central aortic pressure 119/79 with a mean of 96.  LV pressure 121/5  with an EDP of 15.  There was no aortic stenosis.   Left main had some mild ostial plaquing and have at least 90% distal  stenosis involving the trifurcation.   LAD was a long vessel wrapping the apex.  It had significant proximal  disease, which was contiguous with the left main lesion.  This was  followed by a 40% proximal lesion and mild diffuse disease distally.  There it gave off a single diagonal.   Ramus branch  was a small-to-moderate-sized vessel with 70% lesion in the  midsection.   Left circumflex was made up primarily of an OM-1.  There was a long 75%  lesion in the midsection.   Right coronary artery was totally occluded proximally.  There were faint  left-to-right collaterals from the distal LAD.   Left ventriculogram done in the RAO position.  It was difficult due to  poor opacification of the ventricle.  EF appeared to be 50% with  inferior basilar hypokinesis.   Abdominal aortogram showed patent renal arteries bilaterally with a  moderate-sized infrarenal abdominal aortic aneurysm.   ASSESSMENT:  1. Severe three-vessel coronary artery disease with 90% distal left      main lesion and totally occluded right coronary artery.  2. Ejection fraction of 50%.  3. Abdominal aortic aneurysm.   PLAN/DISCUSSION:  He will need bypass surgery.  We have consulted a  Cardiothoracic Surgery.  He did have some mild chest pain after the  catheterization, which was now resolved.  We will discuss with Dr. Donata Clay regarding timing of surgery.  He will need  aggressive risk factor  management including smoking cessation.      Bevelyn Buckles. Bensimhon, MD  Electronically Signed     DRB/MEDQ  D:  11/20/2008  T:  11/21/2008  Job:  [id number]   cc:   Brett Canales A. Cleta Alberts, M.D.

## 2010-10-26 NOTE — Consult Note (Signed)
NAMECRYSTAL, ELLWOOD NO.:  [id number]   MEDICAL RECORD NO.:  [id number]          PATIENT TYPE:  INP   LOCATION:  2807                         FACILITY:  [facility]   PHYSICIAN:  Kerin Perna, M.D.  DATE OF BIRTH:  February 16, 1951   DATE OF CONSULTATION:  11/20/2008  DATE OF DISCHARGE:                                 CONSULTATION   PRIMARY CARE:  [facility].   REASON FOR CONSULTATION:  Left main and severe three-vessel coronary  artery disease of unstable angina.   CHIEF COMPLAINT:  Chest pain.   HISTORY OF PRESENT ILLNESS:  I was asked to evaluate this 60 year old  Caucasian, diabetic nonsmoker for evaluation and treatment of recently  diagnosed left main and severe coronary artery disease.  The patient has  had 10 days of progressive bilateral arm pain squeezing in nature with  exertion and sometimes at rest.  He has experienced multiple episodes.  The worst episode lasting greater than 1 hour and tried aspirin without  benefit.  He had no associated shortness of breath, diaphoresis, nausea,  vomiting, syncope.  His symptoms awoke him at 4:00 a.m. today and he was  transferred to [facility] Emergency Department through the Urgent Care  Center.  He received 4 aspirin tablets 81 mg, but no Plavix.  His  cardiac enzymes were nonspecific and his EKG showed no acute changes  with inverted inferior T-waves.  His chest x-ray is pending.  He  underwent urgent cardiac catheterization by Dr. Gala Romney which  demonstrated a 90% left main stenosis and recent occlusion of the right  coronary artery with EF of 50%.  LVEDP of 14 mmHg and he had a small  infrarenal abdominal aneurysm.  There is no evidence of aortic stenosis  or mitral regurgitation.  He did have some chest pain (3/10) in the cath  lab, but it has been relieved with nitroglycerin and heparin.  He is  currently stable in the cath lab holding area.  Based on his coronary  anatomy and symptoms, he is felt to  be a candidate for surgical coronary  revascularization.   PAST MEDICAL HISTORY:  1. Type 2 diabetes mellitus.  2. Gout.  3. Hypertension.  4. Dyslipidemia.  5. History of kidney stones.  6. Pancreatitis.   MEDICATIONS:  1. Fish oil 1 g.  2. Aspirin 81 mg.  3. Multivitamin 500 mg.  4. Zocor 40 mg daily.  5. Metformin 500 mg daily.   ALLERGIES:  He is allergic to TRICOR, SULFA, COLCHICINE, and ACE  INHIBITORS.   SOCIAL HISTORY:  The patient quit smoking 4 years ago.  He is a  nondrinker.  He manages an irrigation company.  He does hard physical  labor.  He has a partner - girlfriend.   FAMILY HISTORY:  Positive for abdominal aneurysm disease.   REVIEW OF SYSTEMS:  Negative for weight loss or fever.  He has had a  URI/bronchitis this spring with productive cough which has improved some  since he stopped smoking.  ENT:  Negative for dental problems.  He does  have history of left ear surgery.  PULMONARY:  Negative for hemoptysis  or abnormal chest x-ray with pulmonary nodule.  GI:  Negative for GERD.  Positive for pancreatitis.  Negative for jaundice or gallstones.  VASCULAR:  Negative for DVT, claudication, or TIA.  He does have  abdominal aortic aneurysm noted on cath today, this is small.  ENDOCRINE:  Positive for diabetes.  Negative for thyroid disease.   PHYSICAL EXAMINATION:  VITAL SIGNS:  The patient is 5 feet 9 and weighs  210 pounds.  Blood pressure is 138/70, pulse 88 and sinus, temperature  98.7, and saturation 97% on room air.  GENERAL APPEARANCE:  A middle-aged Caucasian male in cath lab holding  area.  No acute distress.  HEENT:  Normocephalic.  Slight ptosis of the left eyelid.  Dentition  adequate.  NECK:  Without JVD, mass, or bruit.  LYMPHATICS:  No palpable supraclavicular or cervical adenopathy.  LUNGS:  Breath sounds are diminished, but clear.  CHEST:  He has no thoracic deformity.  CARDIAC:  Regular rhythm without S3 gallop or murmur.  ABDOMEN:   Soft and nontender without pulsatile mass palpable.  EXTREMITIES:  No clubbing, cyanosis, or edema.  Peripheral pulses are  intact in the extremities and there is no evidence of venous  insufficiency of lower extremities.  NEUROLOGIC:  He is alert and oriented without focal motor deficit.  He  has compression dressing in the right groin.  He is right-hand dominant.   LABORATORY DATA:  I reviewed the coronary arteriograms with Dr.  Gala Romney and agree with his interpretation of severe coronary artery  disease.  His pre-CABG Dopplers are pending.  We will plan on proceeding  with multivessel surgical revascularization with bypass grafts to the  LAD, OM, posterior descending, and possible the ramus tomorrow morning.      Kerin Perna, M.D.  Electronically Signed     PV/MEDQ  D:  11/20/2008  T:  11/21/2008  Job:  [id number]
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
## 2010-10-29 NOTE — H&P (Signed)
NAMESHAHEED, SCHMUCK             ACCOUNT NO.:  [id number]   MEDICAL RECORD NO.:  [id number]          PATIENT TYPE:  EMS   LOCATION:  MAJO                         FACILITY:  [facility]   PHYSICIAN:  [name], M.D.DATE OF BIRTH:  July 07, 1950   DATE OF ADMISSION:  10/19/2005  DATE OF DISCHARGE:                                HISTORY & PHYSICAL   PRIMARY CARE PHYSICIAN:  Dr. Lisa Weber at [facility].   CHIEF COMPLAINT:  Dizziness for two days.   HISTORY OF PRESENTING COMPLAINT:  [name] is a pleasant 60 year old  Caucasian male with a history of diabetes mellitus.  He recently saw his  primary care doctor for routine blood work, and during evaluation last  Friday, he was found to have elevated high blood pressure.  Given his status  as a diabetic, he was placed on lisinopril 10 mg daily.  He should be first  dosed on Monday and continued 10 mg daily.  On Tuesday, he became sick and  dizzy.  The symptoms got worse today.  At work, he was not feeling well at  all.  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  There, he passed out about three times.  He was sweaty and clammy.  Then 911  was called.  On arrival, he was found to be hypotensive with a blood  pressure of 50 systolic and a heart rate of 80.  He was given one liter of  normal saline en route to the emergency room.  On arrival in the emergency  room, blood pressure was 76/48 and heart rate of 81.  So far, he is on his  third liter of IV fluids, and blood pressure is currently 98/67 with a heart  rate in the 70s.   There has been no chest pain.  No vomiting, fever, cough, abdominal pain,  diarrhea.   He had his blood glucose checked during the episode of syncope.  It was  180s.   EKG done in the emergency room showed a normal sinus rhythm with no acute ST  changes.   The first set of cardiac markers in the emergency room showed myoglobin 360  and troponin I less than 0.05.   PAST MEDICAL  HISTORY:  1.  Diabetes mellitus for about 10-12 years, type 2.  2.  Acute pancreatitis in 2005, probably secondary to hypertriglyceridemia.  3.  Dyslipidemia.  4.  History of kidney stones.  5.  Diverticulitis 12 years ago.   PAST SURGICAL HISTORY:  Stapedectomy with middle, anvil, and hammer bones in  his right ear.   FAMILY HISTORY:  Both parents had disease as cause of death, unknown.  One  brother had cerebral aneurysm.  He is alive.  Two brothers had aortic  aneurysm.  One is dead, and the other is alive.  One sister had diabetes and  hypertension.   SOCIAL HISTORY:  The patient quit smoking last year, October.  He does not  drink alcohol.  He works as self-employed, and he Programme researcher, broadcasting/film/video.   PHYSICAL EXAMINATION:  VITAL SIGNS:  Blood pressure 98/67, pulse 76,  respiratory rate 16, O2 sats 95% on room air.  Temperature 96.7.  GENERAL:  Patient is awake, alert and oriented to time, place, and person.  HEENT:  Normocephalic and atraumatic head.  Pupils are equal, round and  reactive to light.  Extraocular muscles are intact.  Not dehydrated.  Not  pale.  LUNGS:  Clinical clear to auscultation.  CARDIOVASCULAR:  S1 and S2.  Regular.  No murmurs, rubs or gallops.  ABDOMEN:  Not distended.  Soft and nontender.  No palpable organomegaly.  EXTREMITIES:  No pedal edema, calf tenderness.  CNS:  No focal neurological deficits.   INITIAL LABORATORY DATA:  White cell count 12.2, hemoglobin 13.3, hematocrit  38.3, platelets 187, neutrophils 84, percent lymphocytes 4%.  Sodium 136,  potassium 4.6, chloride 106, CO2 22, glucose 142, BUN 25, creatinine 2.2,  bilirubin 0.8, alkaline phosphatase 55, AST 44, ALT 32, total protein 6.5,  albumin 4.2, calcium 9.1.   ASSESSMENT/PLAN:  1.  Hypotension:  Most likely secondary to new medication (lisinopril).      Will DC lisinopril, although ARB/ACE inhibitor is indicated in this      patient.  For now, lisinopril will be  discontinued.  Will continue IV      fluids at 150 cc/hr x1 liter, then reduce to 100 cc/hr and monitor blood      pressure closely.  Will cycle cardiac enzymes.  2.  Renal insufficiency:  Probably this is acute secondary to hypotension on      the background of diabetes mellitus.  Will continue IV fluids and check      a renal panel in a.m.  3.  Diabetes mellitus:  Will hold Metformin secondary to renal      insufficiency.  Continue Actos 15 mg daily.  Given Lantus 10 units subcu      q.h.s. until Metformin can be reintroduced.  If creatinine improves, we      will resume Metformin on discharge.  4.  Dyslipidemia:  Continue Lipitor.  Check liver function tests again in      the a.m.      [name], M.D.  Electronically Signed     MBB/MEDQ  D:  10/19/2005  T:  10/19/2005  Job:  [id number]   cc:   [facility]

## 2010-10-29 NOTE — Discharge Summary (Signed)
NAMEMARTON, [name]             ACCOUNT NO.:  [id number]   MEDICAL RECORD NO.:  [id number]          PATIENT TYPE:  INP   LOCATION:  2905                         FACILITY:  [facility]   PHYSICIAN:  Hillery Aldo, M.D.   DATE OF BIRTH:  February 15, 1951   DATE OF ADMISSION:  10/19/2005  DATE OF DISCHARGE:  10/20/2005                                 DISCHARGE SUMMARY   PRIMARY CARE PHYSICIAN:  Dr. Thayer Ohm Guest at the Urgent Care on [address]  in [place]   DISCHARGE DIAGNOSES:  1.  Hypotension secondary to recent start of lisinopril briefly requiring      pressor support.  2.  Acute renal insufficiency secondary to initiation of ACE inhibitor      therapy and hypotension, resolving.  3.  Type 2 diabetes.  4.  Dyslipidemia, controlled.   DISCHARGE MEDICATIONS:  1.  Lipitor 20 mg daily.  2.  Actos 15 mg daily.  3.  Metformin 500 mg b.i.d.   Note:  The patient was instructed to discontinue lisinopril.   CONSULTATIONS:  None.   BRIEF ADMISSION HPI:  The patient is a 60 year old male who was recently  started on lisinopril 10 mg daily for hypertension in the setting of known  diabetes.  Patient reports that his blood pressure is usually well  controlled; however, on his most recent visit to his physician his blood  pressure was elevated which prompted the initiation of antihypertensive  therapy with an ACE inhibitor.  Patient reports that over the past 24 hours  he has become dizzy and not feeling well in general.  He started the ACE  inhibitor on Oct 17, 2005.  Upon initial evaluation in the emergency  department he was noted to be profoundly hypotensive and was admitted for  further evaluation and work-up.   PROCEDURES AND DIAGNOSTIC STUDIES:  None.   DISCHARGE LABORATORY VALUES:  CBC showed a white blood cell count 7.8,  hemoglobin 12.3, hematocrit 35.5, platelets 188.  Sodium was 140, potassium  4.1, chloride 110, bicarbonate 22, BUN 19, creatinine 1.1, glucose 112.  Cardiac  enzymes were negative x2.  Cholesterol was 101, LDL 54, HDL 36, and  triglycerides 56.   HOSPITAL COURSE:  #1 - HYPOTENSION:  The patient's profound hypotension was  thought to be secondary to his recent start of lisinopril for treatment of  hypertension.  Patient was given several liters of IV fluid boluses and  briefly put on dopamine to stabilize blood pressure.  The dopamine was  quickly titrated off and he remained hemodynamically stable through the  night.  Orthostatic blood pressure and pulse was checked in the morning and  he was not orthostatic and so is deemed stable for discharge home.  He was  instructed to discontinue use of the lisinopril.   #2 - ACUTE RENAL INSUFFICIENCY:  The patient had acute renal insufficiency  with a creatinine of 2 on admission.  His acute renal insufficiency is  likely secondary to initiation of therapy with an ACE inhibitor and  hypotension.  With restoration of his blood pressure and IV fluid  rehydration his creatinine has declined  from 2 to 1.1.  He is instructed to  increase his oral fluid intake over the next 24 hours.   #3 - DIABETES:  The patient was stable with regard to his diabetes.  His  metformin was held given his acute renal insufficiency.  He was treated with  a 10-unit dose of Lantus on the night of admission.  Given this, we have  instructed him to hold his a.m. dose of metformin today.   #4 - DYSLIPIDEMIA:  Patient did have fasting lipid panel done this morning.  His lipids are well controlled on his current Statin therapy.   DISPOSITION:  The patient will be discharged home and will follow up with  his primary care physician in one to two weeks' time.           ______________________________  Hillery Aldo, M.D.     CR/MEDQ  D:  10/20/2005  T:  10/21/2005  Job:  [id number]   cc:   Jonita Albee, M.D.  Fax: [phone]
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
## 2010-10-29 NOTE — Discharge Summary (Signed)
NAME:  [name], [name]                       ACCOUNT NO.:  [id number]   MEDICAL RECORD NO.:  [id number]                   PATIENT TYPE:  INP   LOCATION:  5006                                 FACILITY:  [facility]   PHYSICIAN:  Leighton Roach McDiarmid, M.D.             DATE OF BIRTH:  [birth year]   DATE OF ADMISSION:  08/30/2003  DATE OF DISCHARGE:  09/02/2003                                 DISCHARGE SUMMARY   DISCHARGE DIAGNOSES:  1. Pancreatitis.  2. Type 2 diabetes.  3. Hypertriglyceridemia.  4. Hypercholesterolemia.   DISCHARGE MEDICATIONS:  1. Lantus 20 units subcu q.h.s.  2. Tricor 145 mg p.o. q.d.  3. Percocet 5/325 one to two p.o. q.4h. p.r.n.   SPECIAL INSTRUCTIONS:  Continue clear diet until the next morning and  continue a low-fat diet.   FOLLOWUP:  Patient has an appointment with Dr. Lance Bosch on March 29 at 3:30 p.m.   CONSULTS:  General surgery.   PROCEDURES:  Abdominal and chest CT.   BRIEF ADMISSION HISTORY:  This is a 60 year old white male who presented  with a two-day history of abdominal pain.  Pain was an 8/10 on a 1-10 scale.  Patient had been seen at urgent care, had a white count of 18 there, and had  a strong family history of AAA.  The patient was sent to the ER for  evaluation.  Physical exam on admission was pertinent for severe left upper  and lower quadrant abdominal pain, tenderness to palpation.  Admission labs  showed a urinalysis which showed greater than 80 ketones that was otherwise  negative.  His lipase was 35, amylase 35.  His BMET showed a sodium of 134,  potassium 3.8, chloride 103, bicarb 11, BUN 8, creatinine 0.8, and glucose  299.  His calcium was 9.5, albumin 3.8, total protein 6.6.  INR 1.1, PT  13.8, PTT 33.  White blood cell count 16.9 with 91% neutrophils, hemoglobin  15.2, hematocrit 42.1, and platelets 215,000.  Patient's CT of the abdomen  and chest showed bilateral opacities at the bases and streaking of the tail  of the pancreas.   HOSPITAL COURSE:  Problem 1.  Pancreatitis.  Was thought to be secondary to  the patient's severe hypertriglyceridemia.  Patient was placed on n.p.o.  diet, was given IV morphine p.r.n. for pain management.  Patient had no  nausea or vomiting during hospitalization.  Patient was placed on IV fluids  at 150 cc an hour, half normal saline with 20 mEq of KCl.  On hospital day  two, patient had less abdominal pain.  On hospital day three, patient was  placed on a clear diet for dinner and did well.  On hospital day four,  patient was continuing to tolerate clear diet without having to use pain  medication and therefore, was felt ready to be discharged from that  standpoint.   Problem 2.  Hypercholesterolemia.  Patient had a fasting  lipid panel drawn  when he came in considering that at urgent care it was of note on the  patient's CBC that his specimen had to be spun down secondary to lipemia.  Patient's total cholesterol is 460 with an HDL of 17.  His triglycerides  were 2511.  Patient was started on Tricor 145 mg during hospitalization.  The patient has tolerated this okay so far.  Patient did report having some  bloating and one episode of diarrhea before discharge.  Patient choose to  continue taking the medication for now and is tolerating it okay so far.  The patient will likely need to be changed to a different medication after  triglycerides have been lowered.  He will definitely need a statin for his  cholesterol.   Problem 3.  Diabetes.  Patient came in and stated that he had been diagnosed  with diabetes approximately 12 years ago.  Was on medication for two years  and was told that he no longer needed the medication and has not had  sufficient followup for his diabetes since then.  Patient again came in with  glucose of 299 and hemoglobin A1c was 13.5.  Patient initially was started  on Lantus 15 units and had a fasting the next morning of 136.  Patient's  dose was increased to 20  units before discharge.  It was decided not to  start the patient on an oral agent during hospitalization considering  patient's recent bout with pancreatitis.  Also, patient's length of stay and  hospitalization would likely have been longer to titrate medications;  however, as patient is self-pay and seems to be trustworthy with his ability  to follow up; therefore, we could not start oral agents inpatient so that  his hospital stay could be shorter and he would follow up outpatient for  that.  Glucotrol was considered; however, patient again had pancreatitis and  did not want __________ the pancreas with that.  Likely could have been safe  to put the patient on metformin; however, he was already having some GI  irritation with the Tricor and therefore did not start that either.  Other  medications were considered; however, looking at the cost of them, decided  to let the patient speak with his primary on an outpatient basis to decide  what he would be started on for oral therapy.  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   Problem 4.  ID.  Patient was febrile on hospital day two with T-max of 102.  Blood cultures were drawn, which were no growth at discharge on hospital day  four.  Urinalysis was done, which was negative except for greater than 1000  glucose and ketones.  The patient was not put on any antibiotic treatment  considering patient was symptomatically improving with his pancreatitis that  was felt to be the source of the fever.  His CBGs were coming down and was  easily managed with a sliding scale insulin.  Discharge labs showed a BMET  of sodium 134, potassium 3.2 (for which the patient was given 40 mEq of KCl  the day of discharge), chloride 110, bicarb 20, BUN 4, creatinine 0.6, calcium 8.4.  Repeat studies on triglycerides  on March 21 showed 1484.      Anastasio Auerbach, MD  Leighton Roach McDiarmid, M.D.    AD/MEDQ  D:  09/02/2003  T:  09/04/2003  Job:  [id number]   cc:   Reinaldo Raddle. Lance Bosch, M.D.  [facility]  [address]  [place]  [place] [address]  Fax: [phone]

## 2011-01-25 ENCOUNTER — Telehealth: Payer: Self-pay | Admitting: Internal Medicine

## 2011-03-22 ENCOUNTER — Encounter: Payer: Self-pay | Admitting: Internal Medicine

## 2011-04-04 ENCOUNTER — Other Ambulatory Visit: Payer: Self-pay | Admitting: Internal Medicine

## 2011-07-05 ENCOUNTER — Ambulatory Visit (INDEPENDENT_AMBULATORY_CARE_PROVIDER_SITE_OTHER): Payer: Medicare Other | Admitting: Emergency Medicine

## 2011-07-05 DIAGNOSIS — E119 Type 2 diabetes mellitus without complications: Secondary | ICD-10-CM

## 2011-07-05 DIAGNOSIS — R079 Chest pain, unspecified: Secondary | ICD-10-CM

## 2011-07-11 ENCOUNTER — Telehealth: Payer: Self-pay | Admitting: Internal Medicine

## 2011-07-11 NOTE — Telephone Encounter (Signed)
i have not seen him in over a year but had a stress test in 2011 which looked ok. He does have a very small. As long as he is not having chest pain with exertion can do almost anything he wants. Would avoid heavy lifting in which he has to strain hard. Otherwise ok

## 2011-07-11 NOTE — Telephone Encounter (Signed)
Mr Mall needs written documentation of what he can and cannot do at the [facility].

## 2011-07-11 NOTE — Telephone Encounter (Signed)
New problem Pt wants to know if he can work out at fitness center in [place] number [phone] Please fax them info on what he can do

## 2011-07-12 NOTE — Telephone Encounter (Signed)
Note faxed. Pt notified.  

## 2011-08-03 ENCOUNTER — Other Ambulatory Visit: Payer: Self-pay | Admitting: Family Medicine

## 2011-08-03 MED ORDER — INDOMETHACIN 50 MG PO CAPS: 50.0000 mg | ORAL_CAPSULE | Freq: Three times a day (TID) | ORAL | Status: DC

## 2011-08-16 ENCOUNTER — Ambulatory Visit (INDEPENDENT_AMBULATORY_CARE_PROVIDER_SITE_OTHER): Payer: Self-pay | Admitting: Internal Medicine

## 2011-08-16 ENCOUNTER — Encounter: Payer: Self-pay | Admitting: Internal Medicine

## 2011-08-16 VITALS — BP 132/82 | HR 70 | Ht 69.0 in | Wt 207.0 lb

## 2011-08-16 DIAGNOSIS — I714 Abdominal aortic aneurysm, without rupture, unspecified: Secondary | ICD-10-CM

## 2011-08-16 DIAGNOSIS — I10 Essential (primary) hypertension: Secondary | ICD-10-CM

## 2011-08-16 DIAGNOSIS — E785 Hyperlipidemia, unspecified: Secondary | ICD-10-CM

## 2011-08-16 DIAGNOSIS — I2510 Atherosclerotic heart disease of native coronary artery without angina pectoris: Secondary | ICD-10-CM

## 2011-08-16 MED ORDER — METFORMIN HCL 500 MG PO TABS: 500.0000 mg | ORAL_TABLET | Freq: Two times a day (BID) | ORAL | Status: DC

## 2011-08-16 MED ORDER — METOPROLOL TARTRATE 25 MG PO TABS: 25.0000 mg | ORAL_TABLET | Freq: Every day | ORAL | Status: DC

## 2011-08-16 NOTE — Assessment & Plan Note (Signed)
Followed by Dr. Cleta Alberts. Goal LDL < 70. Continue statin. Discussed ACCELERATE trial with him regarding the new CETP inhibitor for HDL raising. He is considering it.
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
## 2011-08-16 NOTE — Patient Instructions (Signed)
Your physician wants you to follow-up in: ONE YEAR You will receive a reminder letter in the mail two months in advance. If you don't receive a letter, please call our office to schedule the follow-up appointment.   Your physician has requested that you have an abdominal aorta duplex. During this test, an ultrasound is used to evaluate the aorta. Allow 30 minutes for this exam. Do not eat after midnight the day before and avoid carbonated beverages  

## 2011-08-16 NOTE — Assessment & Plan Note (Signed)
BP much improved. Continue current regimen.

## 2011-08-16 NOTE — Progress Notes (Signed)
Addended by: Freddi Starr on: 08/16/2011 10:22 AM   Modules accepted: Orders

## 2011-08-16 NOTE — Assessment & Plan Note (Signed)
Previously small. Due for repeat u/s.

## 2011-08-16 NOTE — Assessment & Plan Note (Signed)
He looks great. No evidence of ischemia. Continue current regimen.

## 2011-08-29 ENCOUNTER — Encounter (INDEPENDENT_AMBULATORY_CARE_PROVIDER_SITE_OTHER): Payer: Medicare Other

## 2011-08-29 ENCOUNTER — Telehealth: Payer: Self-pay | Admitting: Internal Medicine

## 2011-08-29 ENCOUNTER — Other Ambulatory Visit: Payer: Self-pay | Admitting: *Deleted

## 2011-08-29 DIAGNOSIS — I714 Abdominal aortic aneurysm, without rupture, unspecified: Secondary | ICD-10-CM

## 2011-08-29 MED ORDER — ROSUVASTATIN CALCIUM 40 MG PO TABS: 10.0000 mg | ORAL_TABLET | Freq: Every day | ORAL | Status: DC

## 2011-08-29 NOTE — Telephone Encounter (Signed)
Walk In pt form " Pt Needs Refills On Crestor" sent to Message Nurse 08/29/11/KM

## 2011-09-21 ENCOUNTER — Telehealth: Payer: Self-pay | Admitting: Internal Medicine

## 2011-09-21 NOTE — Telephone Encounter (Signed)
Please return call to patient on mobile #[phone], concerning medication paperwork.

## 2011-09-21 NOTE — Telephone Encounter (Signed)
Patient states has spoke with Deliah Goody RN regarding Monia Pouch papers that need to be fill out and sign by Dr. Gala Romney. Patient will bring the paper work on Friday 04/12 /13 and will give it to the triage nurse if Dr. Prescott Gum nurse is not in.

## 2011-09-21 NOTE — Telephone Encounter (Signed)
Left a message to call back.

## 2011-09-23 ENCOUNTER — Telehealth: Payer: Self-pay | Admitting: Internal Medicine

## 2011-09-23 NOTE — Telephone Encounter (Signed)
Pt Dropped Off [employer] papers to Be Completed By Dr.Bensimhon Will forward these to [employer] 09/23/11/KM

## 2011-09-27 NOTE — Telephone Encounter (Signed)
According to note in the chart, paperwork was sent to dr bensimhon at the hosp.

## 2011-10-25 ENCOUNTER — Encounter: Payer: Self-pay | Admitting: Emergency Medicine

## 2011-10-25 ENCOUNTER — Ambulatory Visit (INDEPENDENT_AMBULATORY_CARE_PROVIDER_SITE_OTHER): Payer: Medicare Other | Admitting: Emergency Medicine

## 2011-10-25 DIAGNOSIS — M791 Myalgia, unspecified site: Secondary | ICD-10-CM

## 2011-10-25 DIAGNOSIS — E782 Mixed hyperlipidemia: Secondary | ICD-10-CM

## 2011-10-25 DIAGNOSIS — E119 Type 2 diabetes mellitus without complications: Secondary | ICD-10-CM

## 2011-10-25 DIAGNOSIS — IMO0001 Reserved for inherently not codable concepts without codable children: Secondary | ICD-10-CM

## 2011-10-25 LAB — GLUCOSE, POCT (MANUAL RESULT ENTRY): POC Glucose: 141

## 2011-10-25 LAB — COMPREHENSIVE METABOLIC PANEL
BUN: 12 mg/dL (ref 6–23)
CO2: 23 mEq/L (ref 19–32)
Calcium: 9.5 mg/dL (ref 8.4–10.5)
Chloride: 106 mEq/L (ref 96–112)
Creat: 0.79 mg/dL (ref 0.50–1.35)
Total Bilirubin: 0.5 mg/dL (ref 0.3–1.2)

## 2011-10-25 LAB — CK: Total CK: 150 U/L (ref 7–232)

## 2011-10-25 NOTE — Progress Notes (Signed)
  Subjective:    Patient ID: [name], male    DOB: [birth year], 61 y.o.   MRN: [id number]  HPI patient in to recheck his high cholesterol high blood pressure coronary disease and diabetes he is doing very well except for severe aching and knowledge his joints and in his muscles. He feels is related to his Crestor his muscles hurt all the time he is going to talk with Dr. Caprice Renshaw about changing it.    Review of Systems     Objective:   Physical Exam  Constitutional: He is oriented to person, place, and time. He appears well-developed and well-nourished.  HENT:  Head: Normocephalic.  Right Ear: External ear normal.  Left Ear: External ear normal.  Eyes: Pupils are equal, round, and reactive to light.  Neck: No thyromegaly present.  Cardiovascular: Normal rate, regular rhythm and normal heart sounds.   Pulmonary/Chest: No respiratory distress. He has no wheezes. He has no rales. He exhibits no tenderness.  Abdominal: He exhibits no distension. There is no tenderness. There is no rebound and no guarding.  Genitourinary: Rectum normal and prostate normal.  Musculoskeletal: He exhibits no edema and no tenderness.  Neurological: He is alert and oriented to person, place, and time.      Results for orders placed in visit on 10/25/11  GLUCOSE, POCT (MANUAL RESULT ENTRY)      Component Value Range   POC Glucose 141    POCT GLYCOSYLATED HEMOGLOBIN (HGB A1C)      Component Value Range   Hemoglobin A1C 6.2        Assessment & Plan:  The patient is struggling with a lot of joint discomfort muscle discomfort. We'll check his basic labs. Be sure we send copy to Dr. Caprice Renshaw and a copy to the patient. No Changes in diabetic treatment at present.

## 2011-10-25 NOTE — Progress Notes (Signed)
  Subjective:    Patient ID: [name], male    DOB: 06/12/1951, 61 y.o.   MRN: [id number]  HPI    Review of Systems     Objective:   Physical Exam        Assessment & Plan:  The results of the LAD is seen to be sent to Dr.Bensimon. The doctor's name on the previous record is incorrect

## 2011-10-26 ENCOUNTER — Telehealth: Payer: Self-pay | Admitting: Internal Medicine

## 2011-10-26 LAB — RHEUMATOID FACTOR: Rhuematoid fact SerPl-aCnc: <10 IU/mL (ref <=14)

## 2011-10-26 NOTE — Telephone Encounter (Signed)
Spoke with pt, application mailed to [employer] for pts crestor. Per the company the application is still in process. Pt made aware

## 2011-10-26 NOTE — Telephone Encounter (Signed)
New msg Pt wants to discuss hi meds with you. Please call

## 2011-12-12 ENCOUNTER — Ambulatory Visit (INDEPENDENT_AMBULATORY_CARE_PROVIDER_SITE_OTHER): Payer: Medicare Other | Admitting: Family Medicine

## 2011-12-12 VITALS — BP 112/70 | HR 78 | Temp 98.9°F | Resp 18 | Ht 68.75 in | Wt 202.0 lb

## 2011-12-12 DIAGNOSIS — J449 Chronic obstructive pulmonary disease, unspecified: Secondary | ICD-10-CM

## 2011-12-12 DIAGNOSIS — J329 Chronic sinusitis, unspecified: Secondary | ICD-10-CM

## 2011-12-12 MED ORDER — MOMETASONE FURO-FORMOTEROL FUM 200-5 MCG/ACT IN AERO: 2.0000 | INHALATION_SPRAY | Freq: Two times a day (BID) | RESPIRATORY_TRACT | Status: DC

## 2011-12-12 MED ORDER — AMOXICILLIN 875 MG PO TABS: 875.0000 mg | ORAL_TABLET | Freq: Two times a day (BID) | ORAL | Status: AC
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
## 2012-02-28 ENCOUNTER — Ambulatory Visit (INDEPENDENT_AMBULATORY_CARE_PROVIDER_SITE_OTHER): Payer: Medicare Other | Admitting: Emergency Medicine

## 2012-02-28 ENCOUNTER — Encounter: Payer: Self-pay | Admitting: Emergency Medicine

## 2012-02-28 VITALS — BP 120/76 | HR 75 | Temp 98.5°F | Resp 16 | Ht 69.0 in | Wt 204.0 lb

## 2012-02-28 DIAGNOSIS — E785 Hyperlipidemia, unspecified: Secondary | ICD-10-CM

## 2012-02-28 DIAGNOSIS — R7309 Other abnormal glucose: Secondary | ICD-10-CM

## 2012-02-28 DIAGNOSIS — Z23 Encounter for immunization: Secondary | ICD-10-CM

## 2012-02-28 DIAGNOSIS — R739 Hyperglycemia, unspecified: Secondary | ICD-10-CM

## 2012-02-28 DIAGNOSIS — N529 Male erectile dysfunction, unspecified: Secondary | ICD-10-CM

## 2012-02-28 LAB — LIPID PANEL
Cholesterol: 116 mg/dL (ref 0–200)
Total CHOL/HDL Ratio: 4 Ratio

## 2012-02-28 LAB — POCT GLYCOSYLATED HEMOGLOBIN (HGB A1C): Hemoglobin A1C: 6.8

## 2012-02-28 LAB — GLUCOSE, POCT (MANUAL RESULT ENTRY): POC Glucose: 134 mg/dl — AB (ref 70–99)

## 2012-02-28 MED ORDER — TADALAFIL 20 MG PO TABS: ORAL_TABLET | ORAL | Status: DC

## 2012-02-28 MED ORDER — ROSUVASTATIN CALCIUM 40 MG PO TABS: 10.0000 mg | ORAL_TABLET | Freq: Every day | ORAL | Status: DC

## 2012-02-28 MED ORDER — METFORMIN HCL 500 MG PO TABS: ORAL_TABLET | ORAL | Status: DC

## 2012-02-28 NOTE — Progress Notes (Signed)
  Subjective:    Patient ID: [name], male    DOB: August 07, 1950, 62 y.o.   MRN: [id number]  HPI patient here to followup on blood pressure and high cholesterol. He is doing well without complaints. He has no chest pain shortness of breath or any problems at the present time. He sees his cardiologist one time a year. He takes his medications as instructed. He is not currently smoking.    Review of Systems patient had recent problems with ED. he is able to get an erection but not sustain an erection .     Objective:   Physical Exam patient is alert and cooperative. Neck is supple. Chest was clear. There are no carotid bruits. There is a healed sternotomy scar cardiac exam is normal. Extremities without edema  Results for orders placed in visit on 02/28/12  GLUCOSE, POCT (MANUAL RESULT ENTRY)      Component Value Range   POC Glucose 134 (*) 70 - 99 mg/dl  POCT GLYCOSYLATED HEMOGLOBIN (HGB A1C)      Component Value Range   Hemoglobin A1C 6.8          Assessment & Plan:  Patient doing well on current medication regimen. We'll check hemoglobin A1c and glucose in that he has had hyperglycemia in the past. We'll give a trial of Cialis and see if this helps with his ED issue.

## 2012-06-26 ENCOUNTER — Ambulatory Visit (INDEPENDENT_AMBULATORY_CARE_PROVIDER_SITE_OTHER): Payer: Medicare Other | Admitting: Emergency Medicine

## 2012-06-26 ENCOUNTER — Encounter: Payer: Self-pay | Admitting: Emergency Medicine

## 2012-06-26 VITALS — BP 110/70 | HR 79 | Temp 97.8°F | Resp 18 | Ht 69.0 in | Wt 204.2 lb

## 2012-06-26 DIAGNOSIS — I2510 Atherosclerotic heart disease of native coronary artery without angina pectoris: Secondary | ICD-10-CM

## 2012-06-26 DIAGNOSIS — I10 Essential (primary) hypertension: Secondary | ICD-10-CM

## 2012-06-26 DIAGNOSIS — E119 Type 2 diabetes mellitus without complications: Secondary | ICD-10-CM

## 2012-06-26 DIAGNOSIS — E785 Hyperlipidemia, unspecified: Secondary | ICD-10-CM

## 2012-06-26 DIAGNOSIS — M2550 Pain in unspecified joint: Secondary | ICD-10-CM

## 2012-06-26 LAB — COMPREHENSIVE METABOLIC PANEL
ALT: 28 U/L (ref 0–53)
BUN: 12 mg/dL (ref 6–23)
CO2: 25 mEq/L (ref 19–32)
Calcium: 9.9 mg/dL (ref 8.4–10.5)
Chloride: 106 mEq/L (ref 96–112)
Creat: 0.72 mg/dL (ref 0.50–1.35)
Glucose, Bld: 146 mg/dL — ABNORMAL HIGH (ref 70–99)
Total Bilirubin: 0.5 mg/dL (ref 0.3–1.2)

## 2012-06-26 LAB — CBC WITH DIFFERENTIAL/PLATELET
Eosinophils Absolute: 0.2 10*3/uL (ref 0.0–0.7)
Eosinophils Relative: 2 % (ref 0–5)
HCT: 41.8 % (ref 39.0–52.0)
Lymphocytes Relative: 31 % (ref 12–46)
Lymphs Abs: 2.1 10*3/uL (ref 0.7–4.0)
MCH: 29.2 pg (ref 26.0–34.0)
MCV: 83.1 fL (ref 78.0–100.0)
Monocytes Absolute: 0.6 10*3/uL (ref 0.1–1.0)
Monocytes Relative: 8 % (ref 3–12)
Platelets: 203 10*3/uL (ref 150–400)
RBC: 5.03 MIL/uL (ref 4.22–5.81)
WBC: 6.9 10*3/uL (ref 4.0–10.5)

## 2012-06-26 LAB — LIPID PANEL
Cholesterol: 82 mg/dL (ref 0–200)
Total CHOL/HDL Ratio: 2.9 Ratio
Triglycerides: 202 mg/dL — ABNORMAL HIGH (ref <150)
VLDL: 40 mg/dL (ref 0–40)

## 2012-06-26 LAB — GLUCOSE, POCT (MANUAL RESULT ENTRY): POC Glucose: 156 mg/dl — AB (ref 70–99)

## 2012-06-26 MED ORDER — METOPROLOL TARTRATE 25 MG PO TABS: 25.0000 mg | ORAL_TABLET | Freq: Every day | ORAL | Status: DC

## 2012-06-26 MED ORDER — MELOXICAM 7.5 MG PO TABS: ORAL_TABLET | ORAL | Status: DC

## 2012-06-26 NOTE — Progress Notes (Signed)
  Subjective:    Patient ID: [name], male    DOB: Jan 21, 1951, 62 y.o.   MRN: [id number]  HPI patient has a known history of coronary artery disease. He is status post coronary artery bypass graft. He normally sees Dr. Milas Kocher  who has now left his cardiology practice. He also has a history of diabetes. He has been taking his medications regular. He received a note from his insurance care that he needs to switch from Unasyn to meloxicam for his gout    Review of Systems     Objective:   Physical Exam patient is alert and cooperative in no distress. There is a well-healed sternotomy scar. His chest is clear. Cardiac exam is regular rate without murmurs abdomen soft nontender. Skin exam reveals multiple petechial lesions  Results for orders placed in visit on 06/26/12  POCT GLYCOSYLATED HEMOGLOBIN (HGB A1C)      Component Value Range   Hemoglobin A1C 15.6    GLUCOSE, POCT (MANUAL RESULT ENTRY)      Component Value Range   POC Glucose 156 (*) 70 - 99 mg/dl   Results for orders placed in visit on 06/26/12  POCT GLYCOSYLATED HEMOGLOBIN (HGB A1C)      Component Value Range   Hemoglobin A1C 6.8    GLUCOSE, POCT (MANUAL RESULT ENTRY)      Component Value Range   POC Glucose 156 (*) 70 - 99 mg/dl        Assessment & Plan:  Patient has been stable. He is taking his medications as directed. Blood pressure is at goal today. I did go ahead and refill his medications until he can get in touch with a new cardiologist. Maryclare Labrador go ahead and check a hemoglobin A1c and sugar today.  He will continue with same treatment regimen. He did have signs of vasculitis on both forearms. There are multiple petechial lesions. A CBC was done to check his platelet count and cmet  done to check his LFTs.

## 2012-06-27 ENCOUNTER — Telehealth: Payer: Self-pay | Admitting: Internal Medicine

## 2012-06-27 NOTE — Telephone Encounter (Signed)
He would need to get re-established with a cardiologist, he lives in [place] can see dr Jens Som there if wanted.

## 2012-06-27 NOTE — Telephone Encounter (Signed)
New Problem:    Is patient still eligible to see Dr, Gala Romney in the CHF clinic?

## 2012-07-25 ENCOUNTER — Encounter: Payer: Self-pay | Admitting: Cardiology

## 2012-07-25 ENCOUNTER — Ambulatory Visit (INDEPENDENT_AMBULATORY_CARE_PROVIDER_SITE_OTHER): Payer: Medicare Other | Admitting: Cardiology

## 2012-07-25 VITALS — BP 142/80 | HR 80 | Wt 210.0 lb

## 2012-07-25 DIAGNOSIS — I714 Abdominal aortic aneurysm, without rupture, unspecified: Secondary | ICD-10-CM

## 2012-07-25 DIAGNOSIS — I10 Essential (primary) hypertension: Secondary | ICD-10-CM

## 2012-07-25 DIAGNOSIS — F17200 Nicotine dependence, unspecified, uncomplicated: Secondary | ICD-10-CM

## 2012-07-25 DIAGNOSIS — I2510 Atherosclerotic heart disease of native coronary artery without angina pectoris: Secondary | ICD-10-CM

## 2012-07-25 DIAGNOSIS — Z720 Tobacco use: Secondary | ICD-10-CM | POA: Insufficient documentation

## 2012-07-25 MED ORDER — METOPROLOL TARTRATE 25 MG PO TABS: ORAL_TABLET | ORAL | Status: DC

## 2012-07-25 NOTE — Assessment & Plan Note (Signed)
Continue aspirin and statin. 
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
## 2012-07-25 NOTE — Assessment & Plan Note (Signed)
Continue statin. Lipids and liver monitored by primary care. 

## 2012-07-25 NOTE — Patient Instructions (Addendum)
Your physician wants you to follow-up in: ONE YEAR WITH DR Jens Som [place] You will receive a reminder letter in the mail two months in advance. If you don't receive a letter, please call our office to schedule the follow-up appointment.   Your physician has requested that you have an abdominal aorta duplex. During this test, an ultrasound is used to evaluate the aorta. Allow 30 minutes for this exam. Do not eat after midnight the day before and avoid carbonated beverages

## 2012-07-25 NOTE — Assessment & Plan Note (Signed)
Repeat abdominal ultrasound March 2014.

## 2012-07-25 NOTE — Assessment & Plan Note (Signed)
Patient counseled on discontinuing but is not interested.

## 2012-07-25 NOTE — Assessment & Plan Note (Signed)
Change metoprolol to 12.5 mg by mouth twice a day.

## 2012-09-03 ENCOUNTER — Encounter (INDEPENDENT_AMBULATORY_CARE_PROVIDER_SITE_OTHER): Payer: Medicare Other

## 2012-09-03 DIAGNOSIS — I714 Abdominal aortic aneurysm, without rupture, unspecified: Secondary | ICD-10-CM

## 2012-09-06 ENCOUNTER — Telehealth: Payer: Self-pay | Admitting: Cardiology

## 2012-09-06 NOTE — Telephone Encounter (Signed)
New problem   Pt is returning your phone call in reference to test results.

## 2012-09-06 NOTE — Telephone Encounter (Signed)
Spoke with pt, .aware of results.  

## 2012-09-13 ENCOUNTER — Other Ambulatory Visit: Payer: Self-pay

## 2012-09-13 DIAGNOSIS — I10 Essential (primary) hypertension: Secondary | ICD-10-CM

## 2012-09-13 MED ORDER — METOPROLOL TARTRATE 25 MG PO TABS: ORAL_TABLET | ORAL | Status: DC

## 2012-10-09 ENCOUNTER — Encounter: Payer: Self-pay | Admitting: Emergency Medicine

## 2012-10-09 ENCOUNTER — Ambulatory Visit (INDEPENDENT_AMBULATORY_CARE_PROVIDER_SITE_OTHER): Payer: Medicare Other | Admitting: Emergency Medicine

## 2012-10-09 VITALS — BP 130/82 | HR 80 | Temp 98.2°F | Resp 16 | Ht 69.0 in | Wt 204.2 lb

## 2012-10-09 DIAGNOSIS — E119 Type 2 diabetes mellitus without complications: Secondary | ICD-10-CM

## 2012-10-09 DIAGNOSIS — E785 Hyperlipidemia, unspecified: Secondary | ICD-10-CM

## 2012-10-09 LAB — LIPID PANEL
Cholesterol: 81 mg/dL (ref 0–200)
VLDL: 32 mg/dL (ref 0–40)

## 2012-10-09 LAB — POCT GLYCOSYLATED HEMOGLOBIN (HGB A1C): Hemoglobin A1C: 7.6

## 2012-10-09 NOTE — Progress Notes (Signed)
  Subjective:    Patient ID: [name], male    DOB: 11/29/1950, 62 y.o.   MRN: [id number]  HPI patient states he is doing well. He has shooting type pains in his chest at times but these only last a few seconds. He is physically very active and doing a lot of yard work and Surveyor, [employer]. He has no complaints of his feet. He sees a cardiologist one time a year regarding his coronary disease and bypass    Review of Systems     Objective:   Physical Exam patient is alert and cooperative and looks good. His neck is supple. His chest is clear to auscultation and percussion. Heart regular rate no murmurs. Extremities are without edema. Results for orders placed in visit on 10/09/12  GLUCOSE, POCT (MANUAL RESULT ENTRY)      Result Value Range   POC Glucose 294 (*) 70 - 99 mg/dl  POCT GLYCOSYLATED HEMOGLOBIN (HGB A1C)      Result Value Range   Hemoglobin A1C 7.6           Assessment & Plan:

## 2013-02-19 ENCOUNTER — Ambulatory Visit (INDEPENDENT_AMBULATORY_CARE_PROVIDER_SITE_OTHER): Payer: Medicare Other | Admitting: Emergency Medicine

## 2013-02-19 ENCOUNTER — Ambulatory Visit: Payer: Medicare Other

## 2013-02-19 ENCOUNTER — Encounter: Payer: Self-pay | Admitting: Emergency Medicine

## 2013-02-19 VITALS — BP 122/76 | HR 70 | Temp 97.9°F | Resp 16 | Ht 69.0 in | Wt 206.4 lb

## 2013-02-19 DIAGNOSIS — M79641 Pain in right hand: Secondary | ICD-10-CM

## 2013-02-19 DIAGNOSIS — F17200 Nicotine dependence, unspecified, uncomplicated: Secondary | ICD-10-CM

## 2013-02-19 DIAGNOSIS — Z125 Encounter for screening for malignant neoplasm of prostate: Secondary | ICD-10-CM

## 2013-02-19 DIAGNOSIS — E119 Type 2 diabetes mellitus without complications: Secondary | ICD-10-CM

## 2013-02-19 DIAGNOSIS — E785 Hyperlipidemia, unspecified: Secondary | ICD-10-CM

## 2013-02-19 DIAGNOSIS — R739 Hyperglycemia, unspecified: Secondary | ICD-10-CM

## 2013-02-19 DIAGNOSIS — M79609 Pain in unspecified limb: Secondary | ICD-10-CM

## 2013-02-19 DIAGNOSIS — I10 Essential (primary) hypertension: Secondary | ICD-10-CM

## 2013-02-19 LAB — LIPID PANEL: Total CHOL/HDL Ratio: 4.8 Ratio

## 2013-02-19 LAB — COMPREHENSIVE METABOLIC PANEL
ALT: 34 U/L (ref 0–53)
AST: 43 U/L — ABNORMAL HIGH (ref 0–37)
Creat: 0.8 mg/dL (ref 0.50–1.35)
Total Bilirubin: 0.6 mg/dL (ref 0.3–1.2)

## 2013-02-19 LAB — POCT GLYCOSYLATED HEMOGLOBIN (HGB A1C): Hemoglobin A1C: 7.5

## 2013-02-19 MED ORDER — METFORMIN HCL 500 MG PO TABS: ORAL_TABLET | ORAL | Status: DC

## 2013-02-19 NOTE — Progress Notes (Signed)
  Subjective:    Patient ID: [name], male    DOB: 1950/07/28, 62 y.o.   MRN: [id number]  HPI Patient's biggest complaint at the present time is hand pain. He has pain in both his hands especially if he does any excessive or he denies any numbness to his hands. He's been to Dr. Phylliss Bob in the past been checked for inflammatory arthritis but he has had no arthritis workup for a number of years he denies any chest pain or shortness of breath. He does go to the cardiologist once a year. He sees his cardiologist once a year for his coronary disease and abdominal aneurysm. He has not had a chest x-ray from the chart since 2010.   Review of Systems     Objective:   Physical Exam patient is alert and cooperative he is not in distress to his neck is supple. Chest clear heart regular rate no murmurs abdomen is soft no tenderness extremity exam reveals normal sensation in his feet pulses 2+ and symmetrical examination of both hands reveals degenerative changes at the PIP and DIP joints. Carpal tunnel testing Tinel's. Phalen's are negative  [facility] reading (PRIMARY) by  Dr.Daub there are degenerative changes involving the PIP and PIP joints of both hands. There is calcific density adjacent to the left fourth finger and the right middle finger no fractures are seen. Chest x-ray shows no acute disease.  Results for orders placed in visit on 02/19/13  GLUCOSE, POCT (MANUAL RESULT ENTRY)      Result Value Range   POC Glucose 198 (*) 70 - 99 mg/dl  POCT GLYCOSYLATED HEMOGLOBIN (HGB A1C)      Result Value Range   Hemoglobin A1C 7.5          Assessment & Plan:

## 2013-02-19 NOTE — Patient Instructions (Addendum)
Increase her metformin to 2 in the morning and 2 at night. Try the paraffin baths and see if they help.

## 2013-02-20 LAB — PSA, MEDICARE: PSA: 0.32 ng/mL (ref <=4.00)

## 2013-04-14 ENCOUNTER — Other Ambulatory Visit: Payer: Self-pay | Admitting: Emergency Medicine

## 2013-04-16 ENCOUNTER — Telehealth: Payer: Self-pay

## 2013-04-16 MED ORDER — ATORVASTATIN CALCIUM 40 MG PO TABS: 40.0000 mg | ORAL_TABLET | Freq: Every day | ORAL | Status: DC

## 2013-04-16 NOTE — Telephone Encounter (Signed)
Call patient and ask him regarding the change in blood pressure with statins. Statins do not have an effect on blood pressure. And he has been on Crestor without difficulty with his blood pressure

## 2013-04-16 NOTE — Telephone Encounter (Signed)
Change to Lipitor 40 mg take one tablet daily #30 tablets refill for one year

## 2013-04-16 NOTE — Telephone Encounter (Signed)
I sent in, but please review. There is a note in computer Statins caused severe drop in his BP

## 2013-04-16 NOTE — Telephone Encounter (Signed)
I called and left message on his answering machine for him to call us. Just have the pharmacy put the Lipitor on hold until we can clarify this. Patient has been on Crestor 20 mg 1 a day and this is a statin. I'm not sure what the information was about drop in blood pressure. Statin drugs do not normally affect blood

## 2013-04-16 NOTE — Telephone Encounter (Signed)
PT WOULD LIKE TO SPEAK WITH DR DAUB OR HIS NURSE REGARDING HIS MEDICATION. PLEASE CALL [phone]

## 2013-04-16 NOTE — Telephone Encounter (Signed)
Called patient, he states his copay went up on the Crestor to $135, he needs a more cost effective medication for his cholesterol. Please advise.

## 2013-04-17 NOTE — Telephone Encounter (Signed)
Patient returned call. He has been on crestor and he did not have any problem with BP. He will start Lipitor next week when his current RX of crestor runs out. He was advised if he has any sxs with Lipitor stop medication and let us know.

## 2013-05-28 ENCOUNTER — Ambulatory Visit (INDEPENDENT_AMBULATORY_CARE_PROVIDER_SITE_OTHER): Payer: Medicare Other | Admitting: Emergency Medicine

## 2013-05-28 ENCOUNTER — Encounter: Payer: Self-pay | Admitting: Emergency Medicine

## 2013-05-28 VITALS — BP 132/74 | HR 75 | Temp 98.3°F | Resp 16 | Ht 68.5 in | Wt 199.8 lb

## 2013-05-28 DIAGNOSIS — Z125 Encounter for screening for malignant neoplasm of prostate: Secondary | ICD-10-CM

## 2013-05-28 DIAGNOSIS — Z8739 Personal history of other diseases of the musculoskeletal system and connective tissue: Secondary | ICD-10-CM

## 2013-05-28 DIAGNOSIS — I10 Essential (primary) hypertension: Secondary | ICD-10-CM

## 2013-05-28 DIAGNOSIS — R739 Hyperglycemia, unspecified: Secondary | ICD-10-CM

## 2013-05-28 DIAGNOSIS — I714 Abdominal aortic aneurysm, without rupture, unspecified: Secondary | ICD-10-CM

## 2013-05-28 DIAGNOSIS — J449 Chronic obstructive pulmonary disease, unspecified: Secondary | ICD-10-CM

## 2013-05-28 DIAGNOSIS — E119 Type 2 diabetes mellitus without complications: Secondary | ICD-10-CM

## 2013-05-28 DIAGNOSIS — E781 Pure hyperglyceridemia: Secondary | ICD-10-CM

## 2013-05-28 DIAGNOSIS — I2510 Atherosclerotic heart disease of native coronary artery without angina pectoris: Secondary | ICD-10-CM

## 2013-05-28 DIAGNOSIS — Z0000 Encounter for general adult medical examination without abnormal findings: Secondary | ICD-10-CM

## 2013-05-28 LAB — LIPID PANEL
Cholesterol: 99 mg/dL (ref 0–200)
HDL: 33 mg/dL — ABNORMAL LOW (ref >39)
Total CHOL/HDL Ratio: 3 Ratio

## 2013-05-28 LAB — CBC WITH DIFFERENTIAL/PLATELET
Basophils Absolute: 0.1 10*3/uL (ref 0.0–0.1)
Basophils Relative: 1 % (ref 0–1)
Eosinophils Absolute: 0.1 10*3/uL (ref 0.0–0.7)
Eosinophils Relative: 2 % (ref 0–5)
HCT: 43.4 % (ref 39.0–52.0)
Lymphocytes Relative: 25 % (ref 12–46)
MCH: 29.6 pg (ref 26.0–34.0)
MCHC: 35.5 g/dL (ref 30.0–36.0)
MCV: 83.5 fL (ref 78.0–100.0)
Monocytes Absolute: 0.8 10*3/uL (ref 0.1–1.0)
Monocytes Relative: 9 % (ref 3–12)
Platelets: 213 10*3/uL (ref 150–400)
RDW: 14 % (ref 11.5–15.5)
WBC: 9.4 10*3/uL (ref 4.0–10.5)

## 2013-05-28 LAB — POCT URINALYSIS DIPSTICK
Blood, UA: NEGATIVE
Glucose, UA: NEGATIVE
Spec Grav, UA: 1.025

## 2013-05-28 LAB — COMPLETE METABOLIC PANEL WITH GFR
AST: 37 U/L (ref 0–37)
Alkaline Phosphatase: 69 U/L (ref 39–117)
BUN: 14 mg/dL (ref 6–23)
Calcium: 10.1 mg/dL (ref 8.4–10.5)
Chloride: 103 mEq/L (ref 96–112)
Creat: 0.96 mg/dL (ref 0.50–1.35)
GFR, Est African American: >89 mL/min

## 2013-05-28 LAB — POCT GLYCOSYLATED HEMOGLOBIN (HGB A1C): Hemoglobin A1C: 7.1

## 2013-05-28 MED ORDER — MOMETASONE FURO-FORMOTEROL FUM 200-5 MCG/ACT IN AERO: 2.0000 | INHALATION_SPRAY | Freq: Two times a day (BID) | RESPIRATORY_TRACT | Status: DC

## 2013-05-28 MED ORDER — ATORVASTATIN CALCIUM 40 MG PO TABS: 40.0000 mg | ORAL_TABLET | Freq: Every day | ORAL | Status: DC

## 2013-05-28 MED ORDER — METOPROLOL TARTRATE 25 MG PO TABS: ORAL_TABLET | ORAL | Status: DC

## 2013-05-28 MED ORDER — METFORMIN HCL 500 MG PO TABS: ORAL_TABLET | ORAL | Status: DC

## 2013-05-28 NOTE — Progress Notes (Deleted)
   Subjective:    Patient ID: BURLON CENTRELLA, male    DOB: Sep 26, 1950, 62 y.o.   MRN: [id number]  HPI    Review of Systems     Objective:   Physical Exam    Results for orders placed in visit on 05/28/13  GLUCOSE, POCT (MANUAL RESULT ENTRY)      Result Value Range   POC Glucose 135 (*) 70 - 99 mg/dl  POCT GLYCOSYLATED HEMOGLOBIN (HGB A1C)      Result Value Range   Hemoglobin A1C 7.1    IFOBT (OCCULT BLOOD)      Result Value Range   IFOBT Negative    POCT URINALYSIS DIPSTICK      Result Value Range   Color, UA yellow     Clarity, UA clear     Glucose, UA neg     Bilirubin, UA small     Ketones, UA 15 mg     Spec Grav, UA 1.025     Blood, UA neg     pH, UA 5.0     Protein, UA trace     Urobilinogen, UA 1.0     Nitrite, UA neg     Leukocytes, UA Negative        Assessment & Plan:
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
## 2013-05-29 LAB — MICROALBUMIN, URINE: Microalb, Ur: 4.54 mg/dL — ABNORMAL HIGH (ref 0.00–1.89)

## 2013-05-29 LAB — TSH: TSH: 1.382 u[IU]/mL (ref 0.350–4.500)

## 2013-08-21 ENCOUNTER — Encounter: Payer: Self-pay | Admitting: Cardiology

## 2013-08-21 ENCOUNTER — Ambulatory Visit (INDEPENDENT_AMBULATORY_CARE_PROVIDER_SITE_OTHER): Payer: Medicare Other | Admitting: Cardiology

## 2013-08-21 VITALS — BP 128/64 | HR 94 | Ht 68.0 in | Wt 207.0 lb

## 2013-08-21 DIAGNOSIS — I10 Essential (primary) hypertension: Secondary | ICD-10-CM

## 2013-08-21 DIAGNOSIS — I714 Abdominal aortic aneurysm, without rupture, unspecified: Secondary | ICD-10-CM

## 2013-08-21 DIAGNOSIS — I2510 Atherosclerotic heart disease of native coronary artery without angina pectoris: Secondary | ICD-10-CM

## 2013-08-21 MED ORDER — AMLODIPINE BESYLATE 5 MG PO TABS: 5.0000 mg | ORAL_TABLET | Freq: Every day | ORAL | Status: DC

## 2013-08-21 NOTE — Assessment & Plan Note (Addendum)
Having problems with ED; DC metoprolol. Begin amlodipine 5 mg daily.

## 2013-08-21 NOTE — Assessment & Plan Note (Signed)
Continue statin. Lipids and liver monitored by primary care. 

## 2013-08-21 NOTE — Assessment & Plan Note (Signed)
Schedule followup abdominal ultrasound. 

## 2013-08-21 NOTE — Patient Instructions (Addendum)
Your physician wants you to follow-up in: [place] will receive a reminder letter in the mail two months in advance. If you don't receive a letter, please call our office to schedule the follow-up appointment.   Your physician has requested that you have an abdominal aorta duplex. During this test, an ultrasound is used to evaluate the aorta. Allow 30 minutes for this exam. Do not eat after midnight the day before and avoid carbonated beverages   STOP METOPROLOL  START AMLODIPINE 5 MG ONCE DAILY

## 2013-08-21 NOTE — Addendum Note (Signed)
Addended by: Cristopher Estimable on: 08/21/2013 02:24 PM   Modules accepted: Orders

## 2013-08-21 NOTE — Assessment & Plan Note (Signed)
Continue aspirin and statin. 
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
## 2013-08-26 ENCOUNTER — Telehealth: Payer: Self-pay | Admitting: Cardiology

## 2013-08-26 ENCOUNTER — Ambulatory Visit (INDEPENDENT_AMBULATORY_CARE_PROVIDER_SITE_OTHER): Payer: Medicare Other

## 2013-08-26 DIAGNOSIS — I714 Abdominal aortic aneurysm, without rupture, unspecified: Secondary | ICD-10-CM

## 2013-08-26 NOTE — Telephone Encounter (Signed)
New problem   Pt retuning your call please call on home number as given.

## 2013-08-26 NOTE — Telephone Encounter (Signed)
Spoke with pt, aware of abdominal US results

## 2013-09-04 ENCOUNTER — Ambulatory Visit: Payer: Medicare Other | Admitting: Cardiology

## 2013-09-24 ENCOUNTER — Ambulatory Visit (INDEPENDENT_AMBULATORY_CARE_PROVIDER_SITE_OTHER): Payer: Medicare Other | Admitting: Emergency Medicine

## 2013-09-24 ENCOUNTER — Encounter: Payer: Self-pay | Admitting: Emergency Medicine

## 2013-09-24 VITALS — BP 128/70 | HR 72 | Temp 98.4°F | Resp 16 | Ht 68.5 in | Wt 203.6 lb

## 2013-09-24 DIAGNOSIS — J029 Acute pharyngitis, unspecified: Secondary | ICD-10-CM

## 2013-09-24 DIAGNOSIS — E119 Type 2 diabetes mellitus without complications: Secondary | ICD-10-CM

## 2013-09-24 LAB — GLUCOSE, POCT (MANUAL RESULT ENTRY): POC Glucose: 196 mg/dl — AB (ref 70–99)

## 2013-09-24 LAB — POCT RAPID STREP A (OFFICE): RAPID STREP A SCREEN: NEGATIVE

## 2013-09-24 LAB — POCT GLYCOSYLATED HEMOGLOBIN (HGB A1C): Hemoglobin A1C: 7.9

## 2013-09-24 NOTE — Patient Instructions (Signed)
Allergic Rhinitis Allergic rhinitis is when the mucous membranes in the nose respond to allergens. Allergens are particles in the air that cause your body to have an allergic reaction. This causes you to release allergic antibodies. Through a chain of events, these eventually cause you to release histamine into the blood stream. Although meant to protect the body, it is this release of histamine that causes your discomfort, such as frequent sneezing, congestion, and an itchy, runny nose.  CAUSES  Seasonal allergic rhinitis (hay fever) is caused by pollen allergens that may come from grasses, trees, and weeds. Year-round allergic rhinitis (perennial allergic rhinitis) is caused by allergens such as house dust mites, pet dander, and mold spores.  SYMPTOMS   Nasal stuffiness (congestion).  Itchy, runny nose with sneezing and tearing of the eyes. DIAGNOSIS  Your health care provider can help you determine the allergen or allergens that trigger your symptoms. If you and your health care provider are unable to determine the allergen, skin or blood testing may be used. TREATMENT  Allergic Rhinitis does not have a cure, but it can be controlled by:  Medicines and allergy shots (immunotherapy).  Avoiding the allergen. Hay fever may often be treated with antihistamines in pill or nasal spray forms. Antihistamines block the effects of histamine. There are over-the-counter medicines that may help with nasal congestion and swelling around the eyes. Check with your health care provider before taking or giving this medicine.  If avoiding the allergen or the medicine prescribed do not work, there are many new medicines your health care provider can prescribe. Stronger medicine may be used if initial measures are ineffective. Desensitizing injections can be used if medicine and avoidance does not work. Desensitization is when a patient is given ongoing shots until the body becomes less sensitive to the allergen.  Make sure you follow up with your health care provider if problems continue. HOME CARE INSTRUCTIONS It is not possible to completely avoid allergens, but you can reduce your symptoms by taking steps to limit your exposure to them. It helps to know exactly what you are allergic to so that you can avoid your specific triggers. SEEK MEDICAL CARE IF:   You have a fever.  You develop a cough that does not stop easily (persistent).  You have shortness of breath.  You start wheezing.  Symptoms interfere with normal daily activities. Document Released: 02/22/2001 Document Revised: 03/20/2013 Document Reviewed: 02/04/2013 [employer] Patient Information 2014 [employer].  
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
## 2013-12-22 ENCOUNTER — Ambulatory Visit (INDEPENDENT_AMBULATORY_CARE_PROVIDER_SITE_OTHER): Payer: Medicare Other | Admitting: Family Medicine

## 2013-12-22 VITALS — BP 122/62 | HR 95 | Temp 97.9°F | Resp 20 | Ht 68.75 in | Wt 204.6 lb

## 2013-12-22 DIAGNOSIS — H01006 Unspecified blepharitis left eye, unspecified eyelid: Secondary | ICD-10-CM

## 2013-12-22 DIAGNOSIS — H01009 Unspecified blepharitis unspecified eye, unspecified eyelid: Secondary | ICD-10-CM

## 2013-12-22 MED ORDER — ERYTHROMYCIN 5 MG/GM OP OINT: 1.0000 "application " | TOPICAL_OINTMENT | Freq: Four times a day (QID) | OPHTHALMIC | Status: DC

## 2013-12-22 NOTE — Patient Instructions (Signed)
Blepharitis Blepharitis is redness, soreness, and swelling (inflammation) of one or both eyelids. It may be caused by an allergic reaction or a bacterial infection. Blepharitis may also be associated with reddened, scaly skin (seborrhea) of the scalp and eyebrows. While you sleep, eye discharge may cause your eyelashes to stick together. Your eyelids may itch, burn, swell, and may lose their lashes. These will grow back. Your eyes may become sensitive. Blepharitis may recur and need repeated treatment. If this is the case, you may require further evaluation by an eye specialist (ophthalmologist). HOME CARE INSTRUCTIONS   Keep your hands clean.  Use a clean towel each time you dry your eyelids. Do not use this towel to clean other areas. Do not share a towel or makeup with anyone.  Wash your eyelids with warm water or warm water mixed with a small amount of baby shampoo. Do this twice a day or as often as needed.  Wash your face and eyebrows at least once a day.  Use warm compresses 2 times a day for 10 minutes at a time, or as directed by your caregiver.  Apply antibiotic ointment as directed by your caregiver.  Avoid rubbing your eyes.  Avoid wearing makeup until you get better.  Follow up with your caregiver as directed. SEEK IMMEDIATE MEDICAL CARE IF:   You have pain, redness, or swelling that gets worse or spreads to other parts of your face.  Your vision changes, or you have pain when looking at lights or moving objects.  You have a fever.  Your symptoms continue for longer than 2 to 4 days or become worse. MAKE SURE YOU:   Understand these instructions.  Will watch your condition.  Will get help right away if you are not doing well or get worse. Document Released: 05/27/2000 Document Revised: 08/22/2011 Document Reviewed: 07/07/2010 Exit[employer] Patient Information 2015 [employer]. This information is not intended to replace advice given to you by your health care  provider. Make sure you discuss any questions you have with your health care provider.  
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
## 2013-12-26 NOTE — Telephone Encounter (Signed)
Close Encounter 

## 2014-01-07 ENCOUNTER — Ambulatory Visit (INDEPENDENT_AMBULATORY_CARE_PROVIDER_SITE_OTHER): Payer: Medicare Other | Admitting: Emergency Medicine

## 2014-01-07 ENCOUNTER — Encounter: Payer: Self-pay | Admitting: Emergency Medicine

## 2014-01-07 VITALS — BP 126/77 | HR 93 | Temp 98.3°F | Resp 16 | Ht 69.0 in | Wt 203.0 lb

## 2014-01-07 DIAGNOSIS — E119 Type 2 diabetes mellitus without complications: Secondary | ICD-10-CM

## 2014-01-07 DIAGNOSIS — I714 Abdominal aortic aneurysm, without rupture, unspecified: Secondary | ICD-10-CM

## 2014-01-07 LAB — POCT GLYCOSYLATED HEMOGLOBIN (HGB A1C): Hemoglobin A1C: 8.7

## 2014-01-07 LAB — GLUCOSE, POCT (MANUAL RESULT ENTRY): POC GLUCOSE: 196 mg/dL — AB (ref 70–99)
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
## 2014-03-04 ENCOUNTER — Encounter: Payer: Self-pay | Admitting: *Deleted

## 2014-03-04 ENCOUNTER — Encounter: Payer: Medicare Other | Attending: Emergency Medicine | Admitting: *Deleted

## 2014-03-04 VITALS — Ht 69.0 in | Wt 202.7 lb

## 2014-03-04 DIAGNOSIS — E119 Type 2 diabetes mellitus without complications: Secondary | ICD-10-CM | POA: Diagnosis present

## 2014-03-04 DIAGNOSIS — Z713 Dietary counseling and surveillance: Secondary | ICD-10-CM | POA: Insufficient documentation

## 2014-03-04 NOTE — Progress Notes (Signed)
dsmeDiabetes Self-Management Education  Visit Type:    Appt. Start Time: 0930 Appt. End Time: 1030  03/04/2014  [name], identified by name and date of birth, is a 63 y.o. male with a diagnosis of Diabetes: Type 2.  Other people present during visit:  Patient   ASSESSMENT  Height 5\' 9"  (1.753 m), weight 202 lb 11.2 oz (91.944 kg). Body mass index is 29.92 kg/(m^2).  Initial Visit Information:  Are you currently following a meal plan?: No   Are you taking your medications as prescribed?: Yes Are you checking your feet?: Yes How many days per week are you checking your feet?: 3 How often do you need to have someone help you when you read instructions, pamphlets, or other written materials from your doctor or pharmacy?: 1 - Never    Psychosocial:     Patient Belief/Attitude about Diabetes: Other (comment) (He believes he is making failrly good food choices and gets plenty of exercise, so he is not aware of what else he can do. He has tested BG in past, did not feel it did anything to improve his control at tthe timel, so not interested now.) Self-care barriers: None Self-management support: Doctor's office Other persons present: Patient Patient Concerns: Nutrition/Meal planning;Medication Special Needs: None Preferred Learning Style: Visual;Hands on Learning Readiness: Contemplating  Complications:   Last HgB A1C per patient/outside source: 8.7 mg/dL How often do you check your blood sugar?: 0 times/day (not testing) Have you had a dilated eye exam in the past 12 months?: Yes Have you had a dental exam in the past 12 months?: Yes  Diet Intake:  Breakfast: small bowl of unsweetened cereal with 1% milk OR 1 donut OR apple OR toaster struddle OR [employer] biscuit with sausage and egg, coffee - black and water Snack (morning): no, unless PNB crackers if working outside [employer]: tuna with crackers or a sandwich OR hot dog and fries, water Snack (afternoon):  occasionally 1 tbsp low fat PNB for hunger OR fresh fruit or raw vegetables Dinner: meat, starch with butter, salad with creamy dressing, fresh cooked vegetables, fresh fruits, water  Snack (evening): popcorn,  Beverage(s): water, coffee,   Exercise:  Exercise: Moderate (walking and yard work daily) Moderate Exercise amount of time (min / week): 150  Individualized Plan for Diabetes Self-Management Training:   Learning Objective:  Patient will have a greater understanding of diabetes self-management.  Patient education plan per assessed needs and concerns is to attend individual sessions for today.   Education Topics Reviewed with Patient Today:  Explored patient's options for treatment of their diabetes Role of diet in the treatment of diabetes and the relationship between the three main macronutrients and blood glucose level Other (comment) (Acknowledged that patient is active on a daily basis) Reviewed patients medication for diabetes, action, purpose, timing of dose and side effects.;Other (comment) (Discussed other diabetes medications including insulin as  potential ways to imporve BG control) Identified appropriate SMBG and/or A1C goals.          PATIENTS GOALS/Plan (Developed by the patient):  Nutrition: Follow meal plan discussed;General guidelines for healthy choices and portions discussed Physical Activity: Exercise 5-7 days per week Medications: Other (comment) (Speak with MD regarding additional Diabetes medication options inculuding DDP-4 Inhibitors and Lantus or Levemir insulin) Monitoring : test blood glucose pre and post meals as discussed  Plan:   Patient Instructions  Plan:  Continue with current eating habits, limiting carb containing foods to moderate portion sizes Include protein in  moderation with your meals and snacks Continue with your activity level daily as tolerated Consider checking BG at alternate times per day   Continue taking medications as  directed by MD Ask MD about additional Diabetes medications that could increase insulin levels in your body safely          Expected Outcomes:  Demonstrated interest in learning. Expect positive outcomes  Education material provided: Living Well with Diabetes and A1C conversion sheet  If problems or questions, patient to contact team via:  Phone  Future DSME appointment: PRN

## 2014-03-04 NOTE — Patient Instructions (Addendum)
Plan:  Continue with current eating habits, limiting carb containing foods to moderate portion sizes Include protein in moderation with your meals and snacks Continue with your activity level daily as tolerated Consider checking BG at alternate times per day   Continue taking medications as directed by MD Ask MD about additional Diabetes medications that could increase insulin levels in your body safely

## 2014-03-12 ENCOUNTER — Telehealth: Payer: Self-pay | Admitting: *Deleted

## 2014-03-12 DIAGNOSIS — I714 Abdominal aortic aneurysm, without rupture, unspecified: Secondary | ICD-10-CM

## 2014-03-12 NOTE — Telephone Encounter (Signed)
Spoke with pt, scheduled for abdominal US 03-18-14 @ [facility] [place] ! 9:15 am. Patient voiced understanding to not eat prior to the appointment

## 2014-03-12 NOTE — Telephone Encounter (Signed)
Spoke with pt, it is time for abdominal US to follow his AAA. Will place the order for the [facility] location

## 2014-03-18 ENCOUNTER — Ambulatory Visit (INDEPENDENT_AMBULATORY_CARE_PROVIDER_SITE_OTHER): Payer: Medicare Other

## 2014-03-18 DIAGNOSIS — I714 Abdominal aortic aneurysm, without rupture, unspecified: Secondary | ICD-10-CM

## 2014-04-01 ENCOUNTER — Ambulatory Visit: Payer: Medicare Other | Admitting: Emergency Medicine

## 2014-04-07 ENCOUNTER — Encounter: Payer: Self-pay | Admitting: Emergency Medicine

## 2014-04-07 ENCOUNTER — Ambulatory Visit (INDEPENDENT_AMBULATORY_CARE_PROVIDER_SITE_OTHER): Payer: Medicare Other | Admitting: Emergency Medicine

## 2014-04-07 VITALS — BP 131/78 | HR 83 | Temp 98.4°F | Resp 16 | Ht 69.5 in | Wt 201.0 lb

## 2014-04-07 DIAGNOSIS — E1110 Type 2 diabetes mellitus with ketoacidosis without coma: Secondary | ICD-10-CM

## 2014-04-07 DIAGNOSIS — Z23 Encounter for immunization: Secondary | ICD-10-CM

## 2014-04-07 DIAGNOSIS — E1310 Other specified diabetes mellitus with ketoacidosis without coma: Secondary | ICD-10-CM

## 2014-04-07 LAB — POCT GLYCOSYLATED HEMOGLOBIN (HGB A1C): Hemoglobin A1C: 9.5

## 2014-04-07 LAB — GLUCOSE, POCT (MANUAL RESULT ENTRY): POC Glucose: 215 mg/dl — AB (ref 70–99)

## 2014-04-07 MED ORDER — BLOOD GLUCOSE MONITOR KIT: PACK | Status: DC

## 2014-04-07 NOTE — Progress Notes (Deleted)
   Subjective:    Patient ID: [name], male    DOB: 1951/02/04, 63 y.o.   MRN: [id number]  HPI    Review of Systems     Objective:   Physical Exam        Assessment & Plan:

## 2014-04-07 NOTE — Patient Instructions (Signed)
Influenza Vaccine (Flu Vaccine, Inactivated or Recombinant) 2014-2015: What You Need to Know 1. Why get vaccinated? Influenza ("flu") is a contagious disease that spreads around the [place] every winter, usually between October and May. Flu is caused by influenza viruses, and is spread mainly by coughing, sneezing, and close contact. Anyone can get flu, but the risk of getting flu is highest among children. Symptoms come on suddenly and may last several days. They can include:  fever/chills  sore throat  muscle aches  fatigue  cough  headache  runny or stuffy nose Flu can make some people much sicker than others. These people include young children, people 65 and older, pregnant women, and people with certain health conditions-such as heart, lung or kidney disease, nervous system disorders, or a weakened immune system. Flu vaccination is especially important for these people, and anyone in close contact with them. Flu can also lead to pneumonia, and make existing medical conditions worse. It can cause diarrhea and seizures in children. Each year thousands of people in the [place] die from flu, and many more are hospitalized. Flu vaccine is the best protection against flu and its complications. Flu vaccine also helps prevent spreading flu from person to person. 2. Inactivated and recombinant flu vaccines You are getting an injectable flu vaccine, which is either an "inactivated" or "recombinant" vaccine. These vaccines do not contain any live influenza virus. They are given by injection with a needle, and often called the "flu shot."  A different live, attenuated (weakened) influenza vaccine is sprayed into the nostrils. This vaccine is described in a separate Vaccine Information Statement. Flu vaccination is recommended every year. Some children 6 months through 8 years of age might need two doses during one year. Flu viruses are always changing. Each year's flu vaccine is made  to protect against 3 or 4 viruses that are likely to cause disease that year. Flu vaccine cannot prevent all cases of flu, but it is the best defense against the disease.  It takes about 2 weeks for protection to develop after the vaccination, and protection lasts several months to a year. Some illnesses that are not caused by influenza virus are often mistaken for flu. Flu vaccine will not prevent these illnesses. It can only prevent influenza. Some inactivated flu vaccine contains a very small amount of a mercury-based preservative called thimerosal. Studies have shown that thimerosal in vaccines is not harmful, but flu vaccines that do not contain a preservative are available. 3. Some people should not get this vaccine Tell the person who gives you the vaccine:  If you have any severe, life-threatening allergies. If you ever had a life-threatening allergic reaction after a dose of flu vaccine, or have a severe allergy to any part of this vaccine, including (for example) an allergy to gelatin, antibiotics, or eggs, you may be advised not to get vaccinated. Most, but not all, types of flu vaccine contain a small amount of egg protein.  If you ever had Guillain-Barr Syndrome (a severe paralyzing illness, also called GBS). Some people with a history of GBS should not get this vaccine. This should be discussed with your doctor.  If you are not feeling well. It is usually okay to get flu vaccine when you have a mild illness, but you might be advised to wait until you feel better. You should come back when you are better. 4. Risks of a vaccine reaction With a vaccine, like any medicine, there is a chance of side   effects. These are usually mild and go away on their own. Problems that could happen after any vaccine:  Brief fainting spells can happen after any medical procedure, including vaccination. Sitting or lying down for about 15 minutes can help prevent fainting, and injuries caused by a fall. Tell  your doctor if you feel dizzy, or have vision changes or ringing in the ears.  Severe shoulder pain and reduced range of motion in the arm where a shot was given can happen, very rarely, after a vaccination.  Severe allergic reactions from a vaccine are very rare, estimated at less than 1 in a million doses. If one were to occur, it would usually be within a few minutes to a few hours after the vaccination. Mild problems following inactivated flu vaccine:  soreness, redness, or swelling where the shot was given  hoarseness  sore, red or itchy eyes  cough  fever  aches  headache  itching  fatigue If these problems occur, they usually begin soon after the shot and last 1 or 2 days. Moderate problems following inactivated flu vaccine:  Young children who get inactivated flu vaccine and pneumococcal vaccine (PCV13) at the same time may be at increased risk for seizures caused by fever. Ask your doctor for more information. Tell your doctor if a child who is getting flu vaccine has ever had a seizure. Inactivated flu vaccine does not contain live flu virus, so you cannot get the flu from this vaccine. As with any medicine, there is a very remote chance of a vaccine causing a serious injury or death. The safety of vaccines is always being monitored. For more information, visit: www.cdc.gov/vaccinesafety/ 5. What if there is a serious reaction? What should I look for?  Look for anything that concerns you, such as signs of a severe allergic reaction, very high fever, or behavior changes. Signs of a severe allergic reaction can include hives, swelling of the face and throat, difficulty breathing, a fast heartbeat, dizziness, and weakness. These would start a few minutes to a few hours after the vaccination. What should I do?  If you think it is a severe allergic reaction or other emergency that can't wait, call 9-1-1 and get the person to the nearest hospital. Otherwise, call your  doctor.  Afterward, the reaction should be reported to the Vaccine Adverse Event Reporting System (VAERS). Your doctor should file this report, or you can do it yourself through the VAERS web site at www.vaers.hhs.gov, or by calling 1-800-822-7967. VAERS does not give medical advice. 6. The National Vaccine Injury Compensation Program The National Vaccine Injury Compensation Program (VICP) is a federal program that was created to compensate people who may have been injured by certain vaccines. Persons who believe they may have been injured by a vaccine can learn about the program and about filing a claim by calling 1-800-338-2382 or visiting the VICP website at www.hrsa.gov/vaccinecompensation. There is a time limit to file a claim for compensation. 7. How can I learn more?  Ask your health care provider.  Call your local or state health department.  Contact the Centers for Disease Control and Prevention (CDC):  Call 1-800-232-4636 (1-800-CDC-INFO) or  Visit CDC's website at www.cdc.gov/flu CDC Vaccine Information Statement (Interim) Inactivated Influenza Vaccine (01/29/2013) Document Released: 03/24/2006 Document Revised: 10/14/2013 Document Reviewed: 05/17/2013 ExitCare Patient Information 2015 [employer]. This information is not intended to replace advice given to you by your health care provider. Make sure you discuss any questions you have with your health   care provider.  

## 2014-04-07 NOTE — Progress Notes (Signed)
   Subjective:    Patient ID: [name], male    DOB: 1951/05/13, 63 y.o.   MRN: [id number] This chart was scribed for Arlyss Queen, MD by Marti Sleigh, Medical Scribe. This patient was seen in Room 27 and the patient's care was started at 10:50 AM.  HPI HPI Comments: [name] is a 63 y.o. male who presents to [facility]. Since his last visit to [facility] 01/07/2014, he has been to see Dr. Stanford Breed for his abdominal aortic aneurism. Measurement was 4.2 cm on 03/12/2014, which was smaller than previous measurement on 08/26/2013 of 4.8cm. Pt does not check his blood sugar. He has been to see the dietitian who recommended he take insulin. He is here today to follow up for his DM. Pt denies CP, SOB or abdominal pain.    Review of Systems  Constitutional: Negative for fever, chills and diaphoresis.  Respiratory: Negative for chest tightness and shortness of breath.   Cardiovascular: Negative for chest pain.  Neurological: Negative for dizziness and headaches.       Objective:   Physical Exam  Nursing note and vitals reviewed. Constitutional: He is oriented to person, place, and time. He appears well-developed and well-nourished.  HENT:  Head: Normocephalic and atraumatic.  Eyes: Pupils are equal, round, and reactive to light.  Neck: No JVD present.  Cardiovascular: Normal rate and regular rhythm.   Pulmonary/Chest: Effort normal and breath sounds normal. No respiratory distress.  Neurological: He is alert and oriented to person, place, and time.  Skin: Skin is warm and dry.  Psychiatric: He has a normal mood and affect. His behavior is normal.   Results for orders placed in visit on 04/07/14  GLUCOSE, POCT (MANUAL RESULT ENTRY)      Result Value Ref Range   POC Glucose 215 (*) 70 - 99 mg/dl  POCT GLYCOSYLATED HEMOGLOBIN (HGB A1C)      Result Value Ref Range   Hemoglobin A1C 9.5         Assessment & Plan:  Patient's A1c is up to 9.5. I do feel he would be a candidate started  on Lantus. I need to convince him that if he starts on Lantus he will have to check his sugars.I personally performed the services described in this documentation, which was scribed in my presence. The recorded information has been reviewed and is accurate.I personally performed the services described in this documentation, which was scribed in my presence. The recorded information has been reviewed and is accurate.

## 2014-04-11 ENCOUNTER — Other Ambulatory Visit: Payer: Self-pay

## 2014-04-11 MED ORDER — LANCETS MISC: Status: DC

## 2014-04-11 MED ORDER — GLUCOSE BLOOD VI STRP: ORAL_STRIP | Status: DC

## 2014-04-11 MED ORDER — BLOOD GLUCOSE MONITOR KIT: PACK | Status: DC

## 2014-05-13 ENCOUNTER — Ambulatory Visit (INDEPENDENT_AMBULATORY_CARE_PROVIDER_SITE_OTHER): Payer: Medicare Other | Admitting: Emergency Medicine

## 2014-05-13 ENCOUNTER — Encounter: Payer: Self-pay | Admitting: Emergency Medicine

## 2014-05-13 VITALS — BP 115/64 | HR 78 | Temp 98.4°F | Resp 16 | Ht 70.0 in | Wt 190.0 lb

## 2014-05-13 DIAGNOSIS — E119 Type 2 diabetes mellitus without complications: Secondary | ICD-10-CM

## 2014-05-13 NOTE — Progress Notes (Signed)
   Subjective:    Patient ID: [name], male    DOB: Nov 02, 1950, 63 y.o.   MRN: [id number] This chart was scribed for Arlyss Queen, MD by Marti Sleigh, Medical Scribe. This patient was seen in Room 21 and the patient's care was started at 11:02 AM.  Chief Complaint  Patient presents with  . Diabetes    HPI HPI Comments: [name] is a 63 y.o. male with a hx of DM, HTN, CAD, AAA, acute renal insufficiency, CABG with stent placement and gout who presents to [facility] reporting for a diabetes follow up. Pt states he has stopped eating meat or dairy of any kind, he has been eating fully vegan. Pt states that his HAs have stopped. Pt denies CP, abdominal pain. Pt endorses left ankle pain, no change from baseline. Pt's cardiologist is Dr. Stanford Breed. Pt stated he is not interested in a pneumonia vaccine.   Pt is a current every day smoker.   Review of Systems  Constitutional: Negative for fever and chills.  Cardiovascular: Negative for chest pain.  Gastrointestinal: Negative for abdominal pain.  Musculoskeletal:       Left ankle pain.  Neurological: Negative for headaches.       Objective:   Physical Exam  Constitutional: He is oriented to person, place, and time. He appears well-developed and well-nourished.  HENT:  Head: Normocephalic and atraumatic.  Eyes: Pupils are equal, round, and reactive to light.  Neck: Neck supple.  Cardiovascular: Normal rate and regular rhythm.   Pulmonary/Chest: Effort normal and breath sounds normal. No respiratory distress.  Neurological: He is alert and oriented to person, place, and time.  Skin: Skin is warm and dry.  Psychiatric: He has a normal mood and affect. His behavior is normal.  Nursing note and vitals reviewed.     Assessment and Plan  patient states he is changed to a vegetarian diet. He does not eat any meat,  fish, dairy products, or certain fruits. He only eats salads and beans. He is going to [place] and spend a month. He  was advised to see me on his return. We'll do an A1c at that time along with a lipid panel and see what his status is. I personally performed the services described in this documentation, which was scribed in my presence. The recorded information has been reviewed and is accurate.

## 2014-06-11 ENCOUNTER — Telehealth: Payer: Self-pay | Admitting: *Deleted

## 2014-06-11 NOTE — Telephone Encounter (Signed)
Phoned & spoke with Larene Beach at [facility] and patient has not had an [place] in 2015, but has one scheduled for June 17, 2014.

## 2014-06-17 DIAGNOSIS — E119 Type 2 diabetes mellitus without complications: Secondary | ICD-10-CM | POA: Diagnosis not present

## 2014-06-17 DIAGNOSIS — Z961 Presence of intraocular lens: Secondary | ICD-10-CM | POA: Diagnosis not present

## 2014-06-17 DIAGNOSIS — D2312 Other benign neoplasm of skin of left eyelid, including canthus: Secondary | ICD-10-CM | POA: Diagnosis not present

## 2014-06-17 DIAGNOSIS — H3531 Nonexudative age-related macular degeneration: Secondary | ICD-10-CM | POA: Diagnosis not present

## 2014-06-17 DIAGNOSIS — H40013 Open angle with borderline findings, low risk, bilateral: Secondary | ICD-10-CM | POA: Diagnosis not present

## 2014-06-17 LAB — HM DIABETES EYE EXAM

## 2014-07-14 ENCOUNTER — Other Ambulatory Visit: Payer: Self-pay | Admitting: Emergency Medicine

## 2014-07-22 DIAGNOSIS — D2312 Other benign neoplasm of skin of left eyelid, including canthus: Secondary | ICD-10-CM | POA: Diagnosis not present

## 2014-07-22 DIAGNOSIS — L821 Other seborrheic keratosis: Secondary | ICD-10-CM | POA: Diagnosis not present

## 2014-08-05 ENCOUNTER — Ambulatory Visit (INDEPENDENT_AMBULATORY_CARE_PROVIDER_SITE_OTHER): Payer: Medicare Other | Admitting: Emergency Medicine

## 2014-08-05 ENCOUNTER — Encounter: Payer: Self-pay | Admitting: Emergency Medicine

## 2014-08-05 VITALS — BP 112/73 | HR 82 | Temp 98.6°F | Resp 16 | Ht 70.0 in | Wt 196.0 lb

## 2014-08-05 DIAGNOSIS — E119 Type 2 diabetes mellitus without complications: Secondary | ICD-10-CM

## 2014-08-05 DIAGNOSIS — I25810 Atherosclerosis of coronary artery bypass graft(s) without angina pectoris: Secondary | ICD-10-CM | POA: Diagnosis not present

## 2014-08-05 DIAGNOSIS — Z951 Presence of aortocoronary bypass graft: Secondary | ICD-10-CM | POA: Diagnosis not present

## 2014-08-05 DIAGNOSIS — R202 Paresthesia of skin: Secondary | ICD-10-CM

## 2014-08-05 LAB — BASIC METABOLIC PANEL
BUN: 8 mg/dL (ref 6–23)
CALCIUM: 9.4 mg/dL (ref 8.4–10.5)
CO2: 22 meq/L (ref 19–32)
CREATININE: 0.7 mg/dL (ref 0.50–1.35)
Chloride: 105 mEq/L (ref 96–112)
Glucose, Bld: 138 mg/dL — ABNORMAL HIGH (ref 70–99)
POTASSIUM: 4.7 meq/L (ref 3.5–5.3)
SODIUM: 138 meq/L (ref 135–145)

## 2014-08-05 LAB — POCT GLYCOSYLATED HEMOGLOBIN (HGB A1C): Hemoglobin A1C: 6.9

## 2014-08-05 LAB — LIPID PANEL
CHOL/HDL RATIO: 4 ratio
Cholesterol: 121 mg/dL (ref 0–200)
HDL: 30 mg/dL — AB (ref >=40)
LDL Cholesterol: 39 mg/dL (ref 0–99)
TRIGLYCERIDES: 261 mg/dL — AB (ref <150)
VLDL: 52 mg/dL — ABNORMAL HIGH (ref 0–40)

## 2014-08-05 LAB — MICROALBUMIN, URINE: Microalb, Ur: 6.9 mg/dL — ABNORMAL HIGH (ref <2.0)

## 2014-08-05 LAB — GLUCOSE, POCT (MANUAL RESULT ENTRY): POC GLUCOSE: 153 mg/dL — AB (ref 70–99)
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
## 2014-08-13 ENCOUNTER — Telehealth: Payer: Self-pay

## 2014-08-13 NOTE — Telephone Encounter (Signed)
Patient is requesting a call back regarding his medication and his lab numbers on tricl & cholestrol levels   [phone]

## 2014-08-15 MED ORDER — AMLODIPINE BESYLATE 5 MG PO TABS: 5.0000 mg | ORAL_TABLET | Freq: Every day | ORAL | Status: DC

## 2014-08-15 MED ORDER — METFORMIN HCL 500 MG PO TABS: 1000.0000 mg | ORAL_TABLET | Freq: Two times a day (BID) | ORAL | Status: DC

## 2014-08-15 MED ORDER — ATORVASTATIN CALCIUM 40 MG PO TABS: 40.0000 mg | ORAL_TABLET | Freq: Every day | ORAL | Status: DC

## 2014-08-15 NOTE — Telephone Encounter (Signed)
Left message on machine to call back  

## 2014-08-15 NOTE — Telephone Encounter (Signed)
Pt notified of chol levels. He needed a RF of his meds. Saw Dr. Everlene Farrier on 2/23 and has a f/u appt in 6 mos. RF'ed meds for 6 months

## 2014-08-25 ENCOUNTER — Other Ambulatory Visit: Payer: Self-pay | Admitting: Physician Assistant

## 2014-08-26 NOTE — Telephone Encounter (Signed)
You saw pt in Feb, but don't see that we have Rxd this for pt since 2013 and not on current med list at Feb visit. Do you want me to deny and advise pt he needs to RTC to discuss this?
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
## 2014-09-03 ENCOUNTER — Ambulatory Visit (INDEPENDENT_AMBULATORY_CARE_PROVIDER_SITE_OTHER): Payer: Medicare Other | Admitting: Cardiology

## 2014-09-03 ENCOUNTER — Encounter: Payer: Self-pay | Admitting: Cardiology

## 2014-09-03 VITALS — BP 118/66 | HR 92 | Ht 70.0 in | Wt 193.0 lb

## 2014-09-03 DIAGNOSIS — I714 Abdominal aortic aneurysm, without rupture, unspecified: Secondary | ICD-10-CM

## 2014-09-03 DIAGNOSIS — I2510 Atherosclerotic heart disease of native coronary artery without angina pectoris: Secondary | ICD-10-CM | POA: Diagnosis not present

## 2014-09-03 DIAGNOSIS — Z720 Tobacco use: Secondary | ICD-10-CM

## 2014-09-03 DIAGNOSIS — I10 Essential (primary) hypertension: Secondary | ICD-10-CM | POA: Diagnosis not present

## 2014-09-03 NOTE — Patient Instructions (Signed)
Your physician wants you to follow-up in: ONE YEAR WITH DR CRENSHAW You will receive a reminder letter in the mail two months in advance. If you don't receive a letter, please call our office to schedule the follow-up appointment.  

## 2014-09-03 NOTE — Assessment & Plan Note (Signed)
Blood pressure controlled. Continue present medications. 

## 2014-09-03 NOTE — Assessment & Plan Note (Signed)
Patient counseled on discontinuing. 

## 2014-09-03 NOTE — Assessment & Plan Note (Signed)
Continue statin. 

## 2014-09-03 NOTE — Assessment & Plan Note (Signed)
Plan follow-up abdominal ultrasoundOctober 2016.

## 2014-09-03 NOTE — Assessment & Plan Note (Signed)
Continue aspirin and statin. We discussed a functional study but he declined.

## 2014-12-08 ENCOUNTER — Ambulatory Visit (INDEPENDENT_AMBULATORY_CARE_PROVIDER_SITE_OTHER): Payer: Medicare Other | Admitting: Emergency Medicine

## 2014-12-08 ENCOUNTER — Ambulatory Visit (INDEPENDENT_AMBULATORY_CARE_PROVIDER_SITE_OTHER): Payer: Medicare Other

## 2014-12-08 VITALS — BP 122/72 | HR 82 | Temp 98.1°F | Resp 17 | Ht 69.5 in | Wt 202.6 lb

## 2014-12-08 DIAGNOSIS — M545 Low back pain, unspecified: Secondary | ICD-10-CM

## 2014-12-08 LAB — POCT UA - MICROSCOPIC ONLY
Bacteria, U Microscopic: NEGATIVE
Casts, Ur, LPF, POC: NEGATIVE
EPITHELIAL CELLS, URINE PER MICROSCOPY: NEGATIVE
Mucus, UA: NEGATIVE
RBC, urine, microscopic: NEGATIVE
WBC, UR, HPF, POC: NEGATIVE
YEAST UA: NEGATIVE

## 2014-12-08 LAB — POCT URINALYSIS DIPSTICK
BILIRUBIN UA: NEGATIVE
Blood, UA: NEGATIVE
GLUCOSE UA: 250
Ketones, UA: 40
Leukocytes, UA: NEGATIVE
Nitrite, UA: NEGATIVE
Protein, UA: NEGATIVE
SPEC GRAV UA: 1.02
UROBILINOGEN UA: 0.2
pH, UA: 5.5
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
## 2014-12-08 NOTE — Patient Instructions (Signed)
Back Pain, Adult Low back pain is very common. About 1 in 5 people have back pain.The cause of low back pain is rarely dangerous. The pain often gets better over time.About half of people with a sudden onset of back pain feel better in just 2 weeks. About 8 in 10 people feel better by 6 weeks.  CAUSES Some common causes of back pain include:  Strain of the muscles or ligaments supporting the spine.  Wear and tear (degeneration) of the spinal discs.  Arthritis.  Direct injury to the back. DIAGNOSIS Most of the time, the direct cause of low back pain is not known.However, back pain can be treated effectively even when the exact cause of the pain is unknown.Answering your caregiver's questions about your overall health and symptoms is one of the most accurate ways to make sure the cause of your pain is not dangerous. If your caregiver needs more information, he or she may order lab work or imaging tests (X-rays or MRIs).However, even if imaging tests show changes in your back, this usually does not require surgery. HOME CARE INSTRUCTIONS For many people, back pain returns.Since low back pain is rarely dangerous, it is often a condition that people can learn to manageon their own.   Remain active. It is stressful on the back to sit or stand in one place. Do not sit, drive, or stand in one place for more than 30 minutes at a time. Take short walks on level surfaces as soon as pain allows.Try to increase the length of time you walk each day.  Do not stay in bed.Resting more than 1 or 2 days can delay your recovery.  Do not avoid exercise or work.Your body is made to move.It is not dangerous to be active, even though your back may hurt.Your back will likely heal faster if you return to being active before your pain is gone.  Pay attention to your body when you bend and lift. Many people have less discomfortwhen lifting if they bend their knees, keep the load close to their bodies,and  avoid twisting. Often, the most comfortable positions are those that put less stress on your recovering back.  Find a comfortable position to sleep. Use a firm mattress and lie on your side with your knees slightly bent. If you lie on your back, put a pillow under your knees.  Only take over-the-counter or prescription medicines as directed by your caregiver. Over-the-counter medicines to reduce pain and inflammation are often the most helpful.Your caregiver may prescribe muscle relaxant drugs.These medicines help dull your pain so you can more quickly return to your normal activities and healthy exercise.  Put ice on the injured area.  Put ice in a plastic bag.  Place a towel between your skin and the bag.  Leave the ice on for 15-20 minutes, 03-04 times a day for the first 2 to 3 days. After that, ice and heat may be alternated to reduce pain and spasms.  Ask your caregiver about trying back exercises and gentle massage. This may be of some benefit.  Avoid feeling anxious or stressed.Stress increases muscle tension and can worsen back pain.It is important to recognize when you are anxious or stressed and learn ways to manage it.Exercise is a great option. SEEK MEDICAL CARE IF:  You have pain that is not relieved with rest or medicine.  You have pain that does not improve in 1 week.  You have new symptoms.  You are generally not feeling well. SEEK   IMMEDIATE MEDICAL CARE IF:   You have pain that radiates from your back into your legs.  You develop new bowel or bladder control problems.  You have unusual weakness or numbness in your arms or legs.  You develop nausea or vomiting.  You develop abdominal pain.  You feel faint. Document Released: 05/30/2005 Document Revised: 11/29/2011 Document Reviewed: 10/01/2013 ExitCare Patient Information 2015 [employer]. This information is not intended to replace advice given to you by your health care provider. Make sure you  discuss any questions you have with your health care provider.  

## 2015-02-03 ENCOUNTER — Encounter: Payer: Self-pay | Admitting: Emergency Medicine

## 2015-02-03 ENCOUNTER — Ambulatory Visit (INDEPENDENT_AMBULATORY_CARE_PROVIDER_SITE_OTHER): Payer: Medicare Other | Admitting: Emergency Medicine

## 2015-02-03 VITALS — BP 113/71 | HR 106 | Temp 98.8°F | Resp 16 | Ht 70.0 in | Wt 202.0 lb

## 2015-02-03 DIAGNOSIS — I10 Essential (primary) hypertension: Secondary | ICD-10-CM | POA: Diagnosis not present

## 2015-02-03 DIAGNOSIS — E119 Type 2 diabetes mellitus without complications: Secondary | ICD-10-CM

## 2015-02-03 DIAGNOSIS — Z951 Presence of aortocoronary bypass graft: Secondary | ICD-10-CM | POA: Diagnosis not present

## 2015-02-03 DIAGNOSIS — E785 Hyperlipidemia, unspecified: Secondary | ICD-10-CM

## 2015-02-03 LAB — BASIC METABOLIC PANEL WITH GFR
BUN: 12 mg/dL (ref 7–25)
CHLORIDE: 100 mmol/L (ref 98–110)
CO2: 21 mmol/L (ref 20–31)
CREATININE: 0.81 mg/dL (ref 0.70–1.25)
Calcium: 9.3 mg/dL (ref 8.6–10.3)
GFR, Est African American: >89 mL/min (ref >=60)
GFR, Est Non African American: >89 mL/min (ref >=60)
Glucose, Bld: 278 mg/dL — ABNORMAL HIGH (ref 65–99)
Potassium: 4.4 mmol/L (ref 3.5–5.3)
Sodium: 137 mmol/L (ref 135–146)

## 2015-02-03 LAB — POCT GLYCOSYLATED HEMOGLOBIN (HGB A1C): Hemoglobin A1C: 7.8

## 2015-02-03 LAB — LIPID PANEL
CHOL/HDL RATIO: 5.4 ratio — AB (ref <=5.0)
Cholesterol: 141 mg/dL (ref 125–200)
HDL: 26 mg/dL — ABNORMAL LOW (ref >=40)
Triglycerides: 764 mg/dL — ABNORMAL HIGH (ref <150)

## 2015-02-03 LAB — GLUCOSE, POCT (MANUAL RESULT ENTRY): POC Glucose: 305 mg/dl — AB (ref 70–99)

## 2015-02-03 MED ORDER — SITAGLIPTIN PHOSPHATE 100 MG PO TABS: 100.0000 mg | ORAL_TABLET | Freq: Every day | ORAL | Status: DC
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
## 2015-02-05 ENCOUNTER — Encounter: Payer: Self-pay | Admitting: Family Medicine

## 2015-02-11 ENCOUNTER — Telehealth: Payer: Self-pay

## 2015-02-11 NOTE — Telephone Encounter (Signed)
Pt was just here on 8/23 and received sitaGLIPtin (JANUVIA) 100 MG tablet [[id number]]. This script is to expensive for him, please advise at  [phone]. He would like a more affordable med.

## 2015-02-11 NOTE — Telephone Encounter (Signed)
All of the second line drugs are expensive. He can talk with the pharmacist to see if there is an alternative that is cheaper but they all cost about the same. He cannot take a sulfonylurea because of his sulfur allergy. I also would be happy to refer him to a diabetic specialist to get their opinion.

## 2015-02-11 NOTE — Telephone Encounter (Signed)
Can we switch to something else? 

## 2015-02-12 MED ORDER — SAXAGLIPTIN HCL 2.5 MG PO TABS: 2.5000 mg | ORAL_TABLET | Freq: Every day | ORAL | Status: DC

## 2015-02-12 NOTE — Telephone Encounter (Signed)
Please call in onglyza  2.5 mg 1 a day. #30 tablets 5 refills

## 2015-02-12 NOTE — Telephone Encounter (Signed)
Spoke with pt, advised another Rx was sent in. He will let me know if he can afford this.

## 2015-02-12 NOTE — Telephone Encounter (Signed)
The pharmacist stated we can try Onglyza or Tragenta.

## 2015-02-13 ENCOUNTER — Telehealth: Payer: Self-pay

## 2015-02-13 NOTE — Telephone Encounter (Signed)
Reference the call from 8/31. He can not afford this med- saxagliptin HCl (ONGLYZA) 2.5 MG TABS tablet [[id number]] either. This one is $205. Please advise at [phone]

## 2015-02-13 NOTE — Telephone Encounter (Signed)
I do not know what to do at this point.

## 2015-02-14 ENCOUNTER — Other Ambulatory Visit: Payer: Self-pay | Admitting: Emergency Medicine

## 2015-02-14 DIAGNOSIS — E119 Type 2 diabetes mellitus without complications: Secondary | ICD-10-CM

## 2015-02-14 NOTE — Telephone Encounter (Signed)
Call patient let him know I will make the referral to endocrine and they can discuss cheaper alternatives. Probably the cheapest option will be on basal insulin injection at night.

## 2015-02-15 ENCOUNTER — Other Ambulatory Visit: Payer: Self-pay | Admitting: Emergency Medicine

## 2015-02-16 NOTE — Telephone Encounter (Signed)
Called pt no answer °

## 2015-02-17 NOTE — Telephone Encounter (Signed)
Left message on voicemail letting pt know. 

## 2015-03-06 ENCOUNTER — Other Ambulatory Visit: Payer: Self-pay | Admitting: Emergency Medicine

## 2015-03-11 ENCOUNTER — Other Ambulatory Visit: Payer: Self-pay | Admitting: Emergency Medicine

## 2015-03-17 ENCOUNTER — Encounter: Payer: Self-pay | Admitting: Emergency Medicine

## 2015-03-18 ENCOUNTER — Encounter: Payer: Self-pay | Admitting: Endocrinology

## 2015-03-25 ENCOUNTER — Encounter: Payer: Self-pay | Admitting: Internal Medicine

## 2015-03-28 ENCOUNTER — Other Ambulatory Visit: Payer: Self-pay | Admitting: Emergency Medicine

## 2015-04-23 ENCOUNTER — Ambulatory Visit (INDEPENDENT_AMBULATORY_CARE_PROVIDER_SITE_OTHER): Payer: Medicare Other | Admitting: Emergency Medicine

## 2015-04-23 ENCOUNTER — Encounter: Payer: Self-pay | Admitting: Emergency Medicine

## 2015-04-23 VITALS — BP 110/62 | HR 93 | Temp 98.8°F | Resp 16 | Ht 68.75 in | Wt 198.2 lb

## 2015-04-23 DIAGNOSIS — E119 Type 2 diabetes mellitus without complications: Secondary | ICD-10-CM | POA: Diagnosis not present

## 2015-04-23 DIAGNOSIS — Z951 Presence of aortocoronary bypass graft: Secondary | ICD-10-CM | POA: Diagnosis not present

## 2015-04-23 DIAGNOSIS — E781 Pure hyperglyceridemia: Secondary | ICD-10-CM

## 2015-04-23 DIAGNOSIS — Z23 Encounter for immunization: Secondary | ICD-10-CM

## 2015-04-23 DIAGNOSIS — Z125 Encounter for screening for malignant neoplasm of prostate: Secondary | ICD-10-CM

## 2015-04-23 DIAGNOSIS — I10 Essential (primary) hypertension: Secondary | ICD-10-CM | POA: Diagnosis not present

## 2015-04-23 LAB — LIPID PANEL
Cholesterol: 106 mg/dL — ABNORMAL LOW (ref 125–200)
HDL: 29 mg/dL — ABNORMAL LOW (ref >=40)
LDL Cholesterol: 4 mg/dL (ref <130)
Total CHOL/HDL Ratio: 3.7 Ratio (ref <=5.0)
Triglycerides: 363 mg/dL — ABNORMAL HIGH (ref <150)
VLDL: 73 mg/dL — ABNORMAL HIGH (ref <30)

## 2015-04-23 LAB — HEMOGLOBIN A1C: Hgb A1c MFr Bld: 8.2 % — AB (ref 4.0–6.0)

## 2015-04-23 LAB — POCT GLYCOSYLATED HEMOGLOBIN (HGB A1C): Hemoglobin A1C: 8.2

## 2015-04-23 LAB — GLUCOSE, POCT (MANUAL RESULT ENTRY): POC GLUCOSE: 284 mg/dL — AB (ref 70–99)
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
## 2015-04-24 LAB — PSA, MEDICARE: PSA: 0.38 ng/mL (ref <=4.00)

## 2015-05-05 ENCOUNTER — Telehealth: Payer: Self-pay

## 2015-05-05 MED ORDER — METFORMIN HCL 500 MG PO TABS: ORAL_TABLET | ORAL | Status: DC

## 2015-05-05 MED ORDER — ATORVASTATIN CALCIUM 40 MG PO TABS: 20.0000 mg | ORAL_TABLET | Freq: Every day | ORAL | Status: DC

## 2015-05-05 MED ORDER — AMLODIPINE BESYLATE 5 MG PO TABS: ORAL_TABLET | ORAL | Status: DC

## 2015-05-05 NOTE — Telephone Encounter (Signed)
Pt called from pharm stating that Dr Everlene Farrier was going to send in these three med RFs at [place]. I do not see them, but checked notes and will RF all three. Lipitor dose was decreased on lab result notes.

## 2015-05-14 ENCOUNTER — Encounter: Payer: Self-pay | Admitting: Family Medicine

## 2015-06-18 DIAGNOSIS — H40013 Open angle with borderline findings, low risk, bilateral: Secondary | ICD-10-CM | POA: Diagnosis not present

## 2015-06-18 DIAGNOSIS — Z961 Presence of intraocular lens: Secondary | ICD-10-CM | POA: Diagnosis not present

## 2015-06-18 DIAGNOSIS — H353131 Nonexudative age-related macular degeneration, bilateral, early dry stage: Secondary | ICD-10-CM | POA: Diagnosis not present

## 2015-06-18 DIAGNOSIS — E119 Type 2 diabetes mellitus without complications: Secondary | ICD-10-CM | POA: Diagnosis not present

## 2015-06-18 LAB — HM DIABETES EYE EXAM

## 2015-06-23 ENCOUNTER — Encounter: Payer: Self-pay | Admitting: Family Medicine

## 2015-06-24 DIAGNOSIS — L304 Erythema intertrigo: Secondary | ICD-10-CM | POA: Diagnosis not present

## 2015-06-24 DIAGNOSIS — D225 Melanocytic nevi of trunk: Secondary | ICD-10-CM | POA: Diagnosis not present

## 2015-07-07 ENCOUNTER — Other Ambulatory Visit: Payer: Self-pay | Admitting: Emergency Medicine

## 2015-07-16 ENCOUNTER — Ambulatory Visit (INDEPENDENT_AMBULATORY_CARE_PROVIDER_SITE_OTHER): Payer: Medicare Other | Admitting: Emergency Medicine

## 2015-07-16 ENCOUNTER — Encounter: Payer: Self-pay | Admitting: Emergency Medicine

## 2015-07-16 VITALS — BP 122/64 | HR 97 | Temp 98.1°F | Resp 16 | Ht 68.0 in | Wt 194.0 lb

## 2015-07-16 DIAGNOSIS — I10 Essential (primary) hypertension: Secondary | ICD-10-CM | POA: Diagnosis not present

## 2015-07-16 DIAGNOSIS — Z951 Presence of aortocoronary bypass graft: Secondary | ICD-10-CM | POA: Diagnosis not present

## 2015-07-16 DIAGNOSIS — E119 Type 2 diabetes mellitus without complications: Secondary | ICD-10-CM

## 2015-07-16 LAB — BASIC METABOLIC PANEL WITH GFR
BUN: 10 mg/dL (ref 7–25)
CHLORIDE: 105 mmol/L (ref 98–110)
CO2: 22 mmol/L (ref 20–31)
Calcium: 10 mg/dL (ref 8.6–10.3)
Creat: 0.86 mg/dL (ref 0.70–1.25)
GFR, Est African American: >89 mL/min (ref >=60)
GLUCOSE: 146 mg/dL — AB (ref 65–99)
Potassium: 4.7 mmol/L (ref 3.5–5.3)
SODIUM: 139 mmol/L (ref 135–146)

## 2015-07-16 LAB — POCT GLYCOSYLATED HEMOGLOBIN (HGB A1C): Hemoglobin A1C: 8

## 2015-07-16 LAB — GLUCOSE, POCT (MANUAL RESULT ENTRY): POC Glucose: 154 mg/dl — AB (ref 70–99)

## 2015-07-16 MED ORDER — INDOMETHACIN 50 MG PO CAPS: ORAL_CAPSULE | ORAL | Status: DC
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
## 2015-07-28 ENCOUNTER — Ambulatory Visit: Payer: Self-pay | Admitting: Emergency Medicine

## 2015-08-06 ENCOUNTER — Other Ambulatory Visit: Payer: Self-pay | Admitting: Emergency Medicine

## 2015-08-06 ENCOUNTER — Ambulatory Visit: Payer: Self-pay | Admitting: Emergency Medicine

## 2015-08-14 ENCOUNTER — Telehealth: Payer: Self-pay | Admitting: Emergency Medicine

## 2015-08-14 ENCOUNTER — Other Ambulatory Visit: Payer: Self-pay

## 2015-08-14 MED ORDER — METFORMIN HCL 500 MG PO TABS: ORAL_TABLET | ORAL | Status: DC

## 2015-08-14 NOTE — Telephone Encounter (Signed)
Rose refilled for pt.

## 2015-08-14 NOTE — Telephone Encounter (Signed)
Patient request a refill on Metformin 500 MG. [place]. [phone].

## 2015-09-06 NOTE — Telephone Encounter (Signed)
Completed.

## 2015-09-16 ENCOUNTER — Telehealth: Payer: Self-pay

## 2015-09-16 MED ORDER — AMLODIPINE BESYLATE 5 MG PO TABS: ORAL_TABLET | ORAL | Status: DC

## 2015-09-16 NOTE — Telephone Encounter (Signed)
Rx sent 

## 2015-09-16 NOTE — Telephone Encounter (Signed)
Patient needs a refill for amplodipine [facility] [address] in [place].  Patient phone: [phone]

## 2015-10-07 ENCOUNTER — Ambulatory Visit (INDEPENDENT_AMBULATORY_CARE_PROVIDER_SITE_OTHER): Payer: Medicare Other | Admitting: Cardiology

## 2015-10-07 ENCOUNTER — Encounter: Payer: Self-pay | Admitting: Cardiology

## 2015-10-07 VITALS — BP 116/80 | HR 92 | Ht 68.0 in | Wt 197.0 lb

## 2015-10-07 DIAGNOSIS — Z951 Presence of aortocoronary bypass graft: Secondary | ICD-10-CM | POA: Diagnosis not present

## 2015-10-07 DIAGNOSIS — I714 Abdominal aortic aneurysm, without rupture, unspecified: Secondary | ICD-10-CM

## 2015-10-07 DIAGNOSIS — Z720 Tobacco use: Secondary | ICD-10-CM

## 2015-10-07 DIAGNOSIS — I2510 Atherosclerotic heart disease of native coronary artery without angina pectoris: Secondary | ICD-10-CM
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
## 2015-10-07 NOTE — Assessment & Plan Note (Signed)
Schedule followup abdominal ultrasound. 

## 2015-10-07 NOTE — Patient Instructions (Signed)
Medication Instructions:   NO CHANGE  Testing/Procedures:  Your physician has requested that you have an abdominal aorta duplex. During this test, an ultrasound is used to evaluate the aorta. Allow 30 minutes for this exam. Do not eat after midnight the day before and avoid carbonated beverages   Follow-Up:  Your physician wants you to follow-up in: ONE YEAR WITH DR CRENSHAW You will receive a reminder letter in the mail two months in advance. If you don't receive a letter, please call our office to schedule the follow-up appointment.   If you need a refill on your cardiac medications before your next appointment, please call your pharmacy.    

## 2015-10-07 NOTE — Assessment & Plan Note (Signed)
Continue statin. 

## 2015-10-07 NOTE — Assessment & Plan Note (Signed)
Continue aspirin and statin. 

## 2015-10-07 NOTE — Assessment & Plan Note (Signed)
Blood pressure controlled. Continue present medications. 

## 2015-10-07 NOTE — Assessment & Plan Note (Signed)
Patient counseled on discontinuing. 

## 2015-11-08 ENCOUNTER — Other Ambulatory Visit: Payer: Self-pay | Admitting: Emergency Medicine

## 2015-11-19 ENCOUNTER — Ambulatory Visit (HOSPITAL_BASED_OUTPATIENT_CLINIC_OR_DEPARTMENT_OTHER)
Admission: RE | Admit: 2015-11-19 | Discharge: 2015-11-19 | Disposition: A | Discharge status: Home / Self Care | Payer: Medicare Other | Source: Ambulatory Visit | Attending: Cardiology | Admitting: Cardiology

## 2015-11-19 DIAGNOSIS — I714 Abdominal aortic aneurysm, without rupture, unspecified: Secondary | ICD-10-CM

## 2015-12-05 ENCOUNTER — Other Ambulatory Visit: Payer: Self-pay | Admitting: Emergency Medicine

## 2015-12-07 ENCOUNTER — Telehealth: Payer: Self-pay

## 2015-12-07 NOTE — Telephone Encounter (Signed)
Patient changed pharmacies and is calling to request a refill for atorvastatin.  [facility] [address] [facility] [place]

## 2015-12-10 MED ORDER — ATORVASTATIN CALCIUM 40 MG PO TABS: 20.0000 mg | ORAL_TABLET | Freq: Every day | ORAL | Status: DC

## 2015-12-10 NOTE — Telephone Encounter (Signed)
RF sent. Pt needs OV/ fasting labs for more. Please advise pt.
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
## 2015-12-11 ENCOUNTER — Telehealth: Payer: Self-pay | Admitting: Emergency Medicine

## 2015-12-11 MED ORDER — ATORVASTATIN CALCIUM 40 MG PO TABS: 20.0000 mg | ORAL_TABLET | Freq: Every day | ORAL | Status: DC

## 2015-12-11 MED ORDER — AMLODIPINE BESYLATE 5 MG PO TABS: ORAL_TABLET | ORAL | Status: DC

## 2015-12-11 MED ORDER — METFORMIN HCL 500 MG PO TABS: 500.0000 mg | ORAL_TABLET | Freq: Two times a day (BID) | ORAL | Status: DC

## 2015-12-11 NOTE — Telephone Encounter (Signed)
[name], I gave pt a RF of atorvastatin of #15 which is a 30 day supply per protocol since he is overdue for f/up on chol/lipid test (last done 04/2015). Pt is also due for DM f/up, seen in Feb w/ A1C of 8.0. It looks like Cherina had refused a RF of metformin on 6/24 because last RF we had put message that pt needed to RTC for more RFs. It doesn't look like pt was even asking about that med though. I did not call pt from home when I sent in his refill of atorvastatin because it was 1:00 in the morning when I sent it. This is why I asked for whoever was making pt calls to let the pt know about RF and need for f/up.

## 2015-12-11 NOTE — Telephone Encounter (Signed)
Spoke to pt regarding medication request Atorvastatin. Informed pt he can pick medications at pharmacy Pt is out of town and needs RF's on Metformin and Amlodopine until f/u visit Pt was seen last vist 07/16/15. Informed pt he will need to f/u in August before next refills are given

## 2015-12-23 DIAGNOSIS — I10 Essential (primary) hypertension: Secondary | ICD-10-CM | POA: Diagnosis not present

## 2015-12-23 DIAGNOSIS — I2510 Atherosclerotic heart disease of native coronary artery without angina pectoris: Secondary | ICD-10-CM | POA: Diagnosis not present

## 2015-12-23 DIAGNOSIS — E119 Type 2 diabetes mellitus without complications: Secondary | ICD-10-CM | POA: Diagnosis not present

## 2015-12-23 DIAGNOSIS — R0602 Shortness of breath: Secondary | ICD-10-CM | POA: Diagnosis not present

## 2015-12-23 DIAGNOSIS — R7989 Other specified abnormal findings of blood chemistry: Secondary | ICD-10-CM | POA: Diagnosis not present

## 2015-12-23 DIAGNOSIS — Z951 Presence of aortocoronary bypass graft: Secondary | ICD-10-CM | POA: Diagnosis not present

## 2015-12-23 DIAGNOSIS — R55 Syncope and collapse: Secondary | ICD-10-CM | POA: Diagnosis not present

## 2015-12-23 DIAGNOSIS — Z888 Allergy status to other drugs, medicaments and biological substances status: Secondary | ICD-10-CM | POA: Diagnosis not present

## 2015-12-23 DIAGNOSIS — E860 Dehydration: Secondary | ICD-10-CM | POA: Diagnosis not present

## 2015-12-23 DIAGNOSIS — T679XXA Effect of heat and light, unspecified, initial encounter: Secondary | ICD-10-CM | POA: Diagnosis not present

## 2015-12-23 DIAGNOSIS — T678XXA Other effects of heat and light, initial encounter: Secondary | ICD-10-CM | POA: Diagnosis not present

## 2015-12-23 DIAGNOSIS — Z5320 Procedure and treatment not carried out because of patient's decision for unspecified reasons: Secondary | ICD-10-CM | POA: Diagnosis not present

## 2015-12-24 ENCOUNTER — Telehealth: Payer: Self-pay

## 2015-12-24 DIAGNOSIS — R55 Syncope and collapse: Secondary | ICD-10-CM | POA: Diagnosis not present

## 2015-12-24 NOTE — Telephone Encounter (Signed)
Pt was seen in hospital yesterday for heat exhaustion and wanted to give dr daub his labs and see if he still needed follow up with him since they recommended he see his pcp  Bun 15.6 co2 18 glu 230 wbc 18.5 h&h normal creatine 1.74   Pt is feeling much better and has urinated at least 4 times since last night and it is not colored  Best number [phone]

## 2015-12-25 NOTE — Telephone Encounter (Signed)
Yes he does need to follow-up. He needs repeat blood work to be sure they are returning to normal. Please encouraged to continue to drink good quantities of fluid. I am only here till noon today but he can see any provider. I will be here tomorrow from 8 until 3.

## 2015-12-28 NOTE — Telephone Encounter (Signed)
Left message for patient to return call.

## 2015-12-31 NOTE — Telephone Encounter (Signed)
Advised pt

## 2015-12-31 NOTE — Telephone Encounter (Signed)
Left message for pt to call back  °

## 2016-01-05 ENCOUNTER — Other Ambulatory Visit: Payer: Self-pay | Admitting: Emergency Medicine

## 2016-01-07 NOTE — Telephone Encounter (Signed)
Dr Everlene Farrier, we had put note on last RF that pt needs OV for more because he hasn't had lipid panel or discussed since 04/2015. I see pt is supposed to RTC for hosp f/up. Do you want him to come fasting for lipids too, or give another 90 day RF to cover until Nov?

## 2016-01-18 ENCOUNTER — Ambulatory Visit (INDEPENDENT_AMBULATORY_CARE_PROVIDER_SITE_OTHER): Payer: Medicare Other | Admitting: Emergency Medicine

## 2016-01-18 VITALS — BP 122/72 | HR 88 | Temp 98.2°F | Resp 17 | Ht 68.0 in | Wt 174.0 lb

## 2016-01-18 DIAGNOSIS — E119 Type 2 diabetes mellitus without complications: Secondary | ICD-10-CM | POA: Diagnosis not present

## 2016-01-18 DIAGNOSIS — E781 Pure hyperglyceridemia: Secondary | ICD-10-CM

## 2016-01-18 DIAGNOSIS — T675XXD Heat exhaustion, unspecified, subsequent encounter: Secondary | ICD-10-CM | POA: Diagnosis not present

## 2016-01-18 DIAGNOSIS — Z1159 Encounter for screening for other viral diseases: Secondary | ICD-10-CM | POA: Diagnosis not present

## 2016-01-18 DIAGNOSIS — I10 Essential (primary) hypertension: Secondary | ICD-10-CM

## 2016-01-18 LAB — POCT CBC
GRANULOCYTE PERCENT: 71.8 % (ref 37–80)
HCT, POC: 43.2 % — AB (ref 43.5–53.7)
Hemoglobin: 15.6 g/dL (ref 14.1–18.1)
Lymph, poc: 2.1 (ref 0.6–3.4)
MCH: 30.3 pg (ref 27–31.2)
MCHC: 36 g/dL — AB (ref 31.8–35.4)
MCV: 84 fL (ref 80–97)
MID (CBC): 0.7 (ref 0–0.9)
MPV: 7.1 fL (ref 0–99.8)
PLATELET COUNT, POC: 218 10*3/uL (ref 142–424)
POC Granulocyte: 7 — AB (ref 2–6.9)
POC LYMPH %: 21.3 % (ref 10–50)
POC MID %: 6.9 % (ref 0–12)
RBC: 5.15 M/uL (ref 4.69–6.13)
RDW, POC: 13 %
WBC: 9.7 10*3/uL (ref 4.6–10.2)

## 2016-01-18 LAB — BASIC METABOLIC PANEL WITH GFR
BUN: 10 mg/dL (ref 7–25)
CALCIUM: 9.5 mg/dL (ref 8.6–10.3)
CO2: 23 mmol/L (ref 20–31)
Chloride: 102 mmol/L (ref 98–110)
Creat: 0.86 mg/dL (ref 0.70–1.25)
GLUCOSE: 195 mg/dL — AB (ref 65–99)
POTASSIUM: 4.5 mmol/L (ref 3.5–5.3)
Sodium: 138 mmol/L (ref 135–146)

## 2016-01-18 LAB — LIPID PANEL
Cholesterol: 140 mg/dL (ref 125–200)
HDL: 29 mg/dL — AB (ref >=40)
TRIGLYCERIDES: 542 mg/dL — AB (ref <150)
Total CHOL/HDL Ratio: 4.8 Ratio (ref <=5.0)

## 2016-01-18 LAB — HEPATITIS C ANTIBODY: HCV Ab: NEGATIVE

## 2016-01-18 LAB — GLUCOSE, POCT (MANUAL RESULT ENTRY): POC GLUCOSE: 205 mg/dL — AB (ref 70–99)

## 2016-01-18 LAB — MICROALBUMIN, URINE: MICROALB UR: 14.6 mg/dL

## 2016-01-18 LAB — POCT GLYCOSYLATED HEMOGLOBIN (HGB A1C): HEMOGLOBIN A1C: 8.1

## 2016-01-18 LAB — CK: Total CK: 70 U/L (ref 7–232)

## 2016-01-18 MED ORDER — METFORMIN HCL 500 MG PO TABS: 500.0000 mg | ORAL_TABLET | Freq: Two times a day (BID) | ORAL | 3 refills | Status: DC

## 2016-01-18 MED ORDER — ATORVASTATIN CALCIUM 40 MG PO TABS: 20.0000 mg | ORAL_TABLET | Freq: Every day | ORAL | 3 refills | Status: DC

## 2016-01-18 NOTE — Progress Notes (Signed)
8.1  
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
## 2016-01-18 NOTE — Patient Instructions (Signed)
     IF you received an x-ray today, you will receive an invoice from [facility]. Please contact [facility] at [phone] with questions or concerns regarding your invoice.   IF you received labwork today, you will receive an invoice from [facility]. Please contact [facility] at [phone] with questions or concerns regarding your invoice.   Our billing staff will not be able to assist you with questions regarding bills from these companies.  You will be contacted with the lab results as soon as they are available. The fastest way to get your results is to activate your My Chart account. Instructions are located on the last page of this paperwork. If you have not heard from us regarding the results in 2 weeks, please contact this office.      

## 2016-01-22 ENCOUNTER — Telehealth: Payer: Self-pay

## 2016-01-22 ENCOUNTER — Other Ambulatory Visit: Payer: Self-pay | Admitting: Pharmacist

## 2016-01-22 NOTE — Telephone Encounter (Signed)
[facility] stating that pt claims the meformin rx is written wrong it should say 2 tabs twice a day not 1 tab twice a day   Best number [phone]

## 2016-01-22 NOTE — Telephone Encounter (Signed)
Dr. Everlene Farrier please clarify.

## 2016-01-22 NOTE — Patient Outreach (Signed)
Outreach call to [place] regarding his request for follow up from the [facility] Medication Adherence Campaign. Left a HIPAA compliant message on the patient's voicemail.  Harlow Asa, PharmD Clinical Pharmacist [facility] [phone]

## 2016-01-23 ENCOUNTER — Other Ambulatory Visit: Payer: Self-pay | Admitting: Emergency Medicine

## 2016-01-23 MED ORDER — METFORMIN HCL 500 MG PO TABS: 1000.0000 mg | ORAL_TABLET | Freq: Two times a day (BID) | ORAL | 3 refills | Status: DC

## 2016-01-23 NOTE — Telephone Encounter (Signed)
Call patient. I have corrected the prescription.

## 2016-01-25 NOTE — Patient Outreach (Signed)
Receive a voicemail from [name] returning my call. Called and spoke with patient. HIPAA identifiers verified and verbal consent received.  [name] reports that he takes his atorvastatin once daily everyday. Patient denies any issues with medication adherence, such as cost or side effects. Reports that the had an issue with getting his metformin filled last week, but that this issue has been resolved.   Patient states that he has no medication questions or concerns for me at this time. Patient confirms that he has my phone number.  [name], PharmD Clinical Pharmacist [facility] [phone]

## 2016-02-08 ENCOUNTER — Telehealth: Payer: Self-pay

## 2016-02-08 NOTE — Telephone Encounter (Signed)
Patient called in very upset and hostile over a medication that he stated was suppose to be sent to [facility] [place] instead of the [facility] in [place]. Stated that Dr Everlene Farrier told him he would personally transfer it for him and it wasn't done, he stated he was tired of all the changes here and he was going to leave this place. I then explained to him that he would have to call the pharmacies and have them transfer for the RX for him and he started yelling that I had an attitude and that I could bite him.

## 2016-02-10 IMAGING — US US RETROPERITONEAL LIMITED
1 series · 14 of 25 positions shown · non-contrast
Comparison: None.

CLINICAL DATA: Abdominal aortic aneurysm.

EXAM:
ULTRASOUND OF ABDOMINAL AORTA
TECHNIQUE: Ultrasound examination of the abdominal aorta was performed to
evaluate for abdominal aortic aneurysm.

[Series 1: us retroperitoneal limited · 0.37mm/px · 14 of 34 slices shown]
[im 1/34]
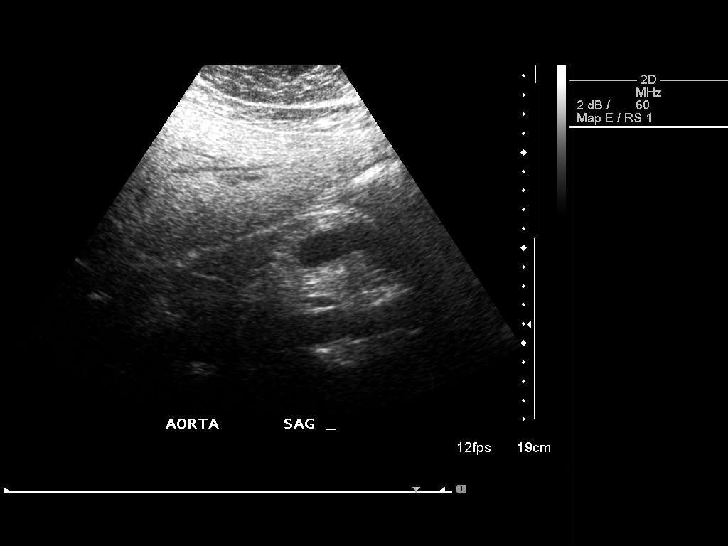
[im 3/34]
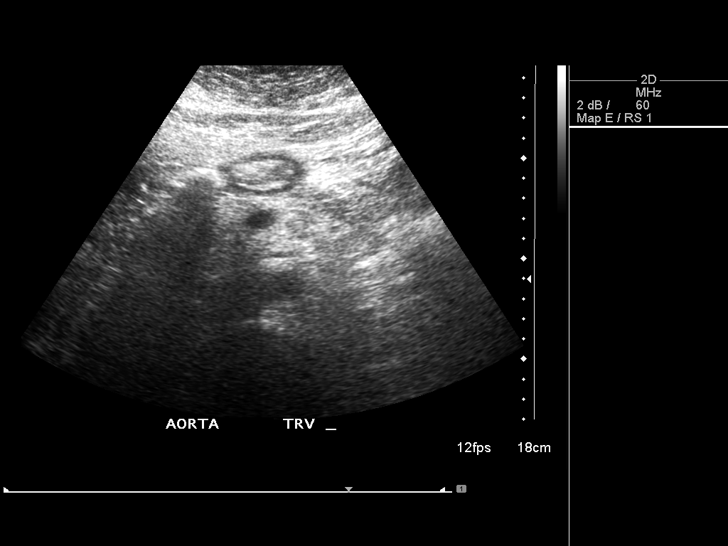
[im 6/34]
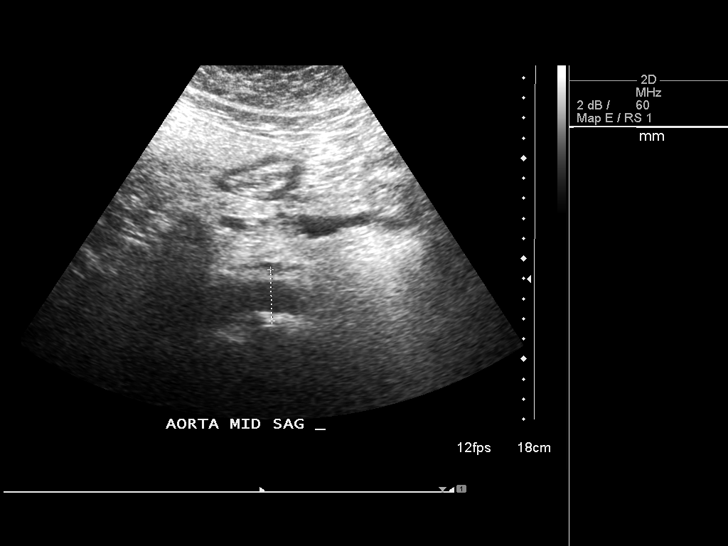
[im 9/34]
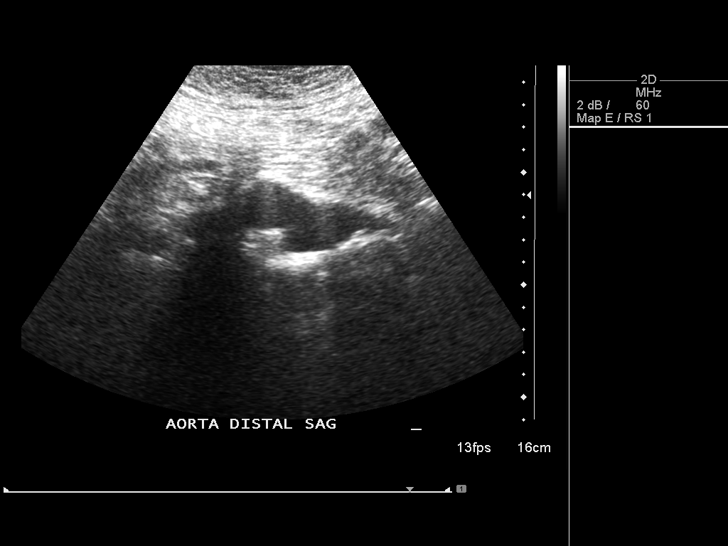
[im 12/34]
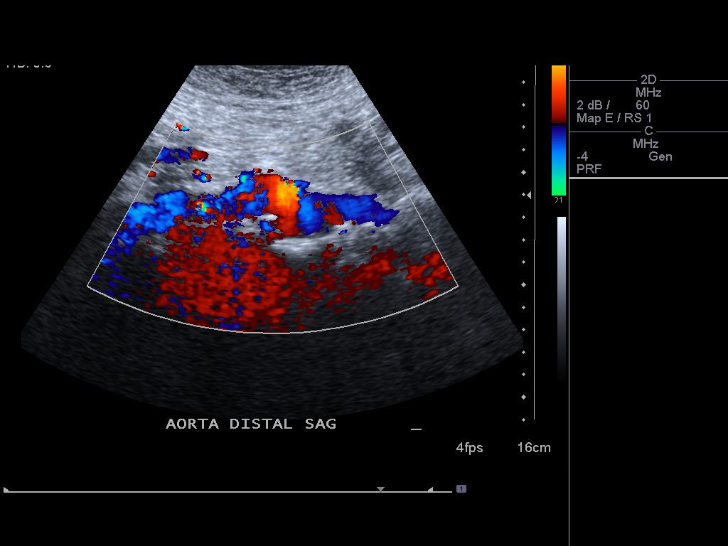
[im 13/34]
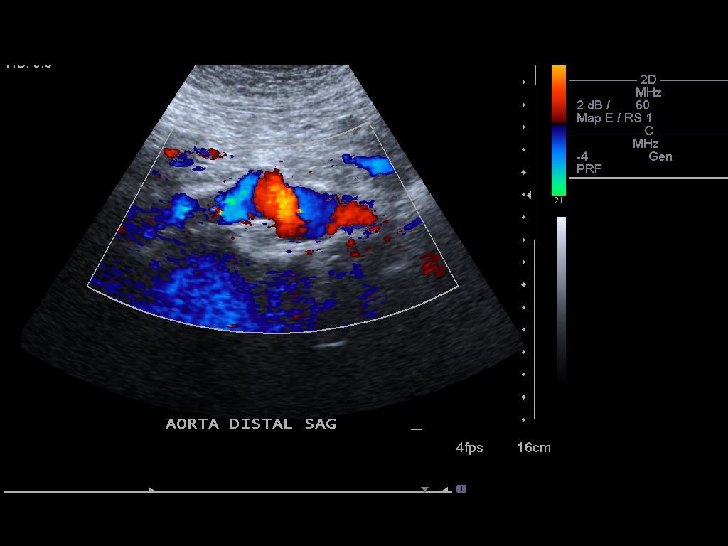
[im 16/34]
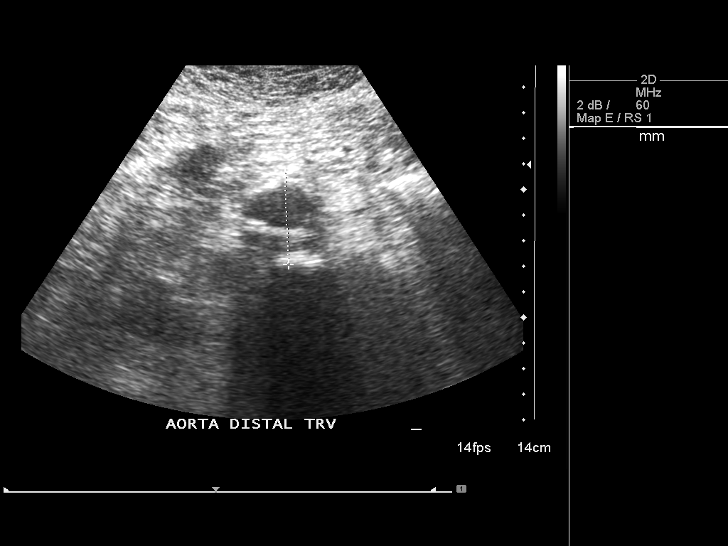
[im 18/34]
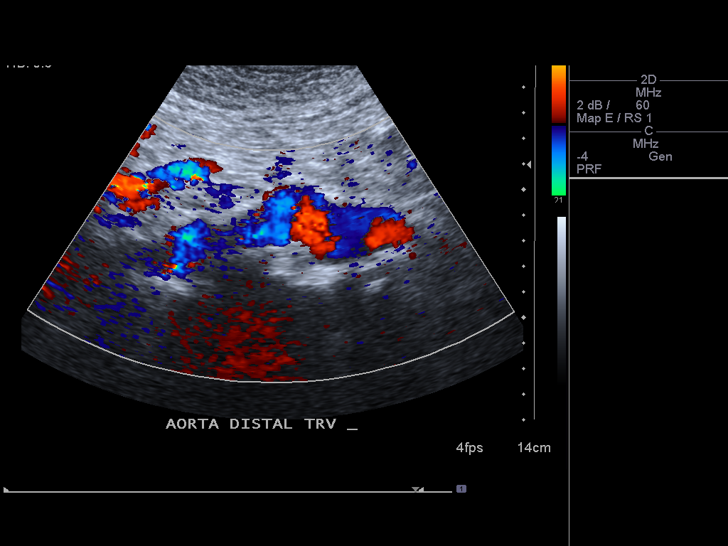
[im 21/34]
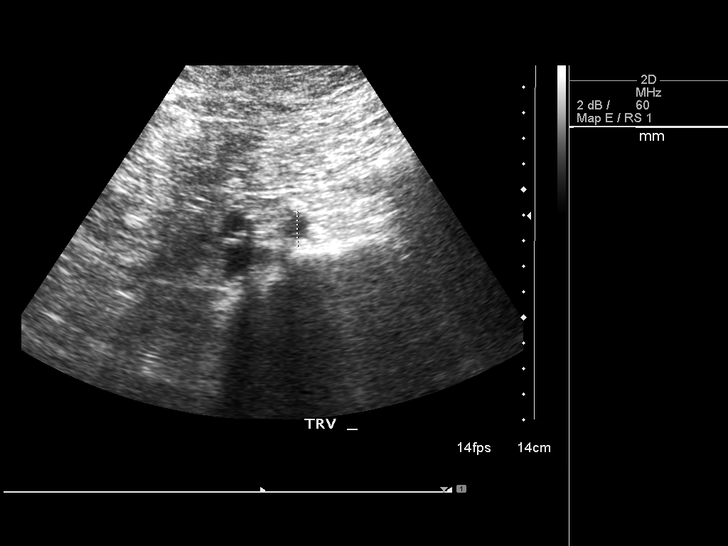
[im 23/34]
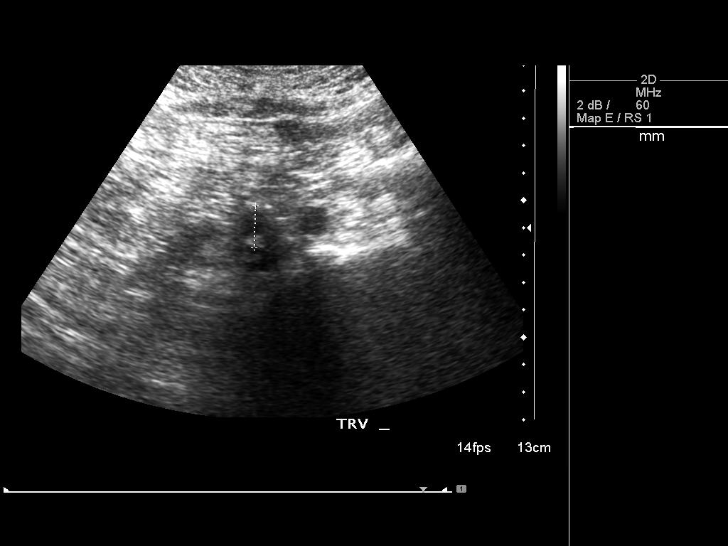
[im 25/34]
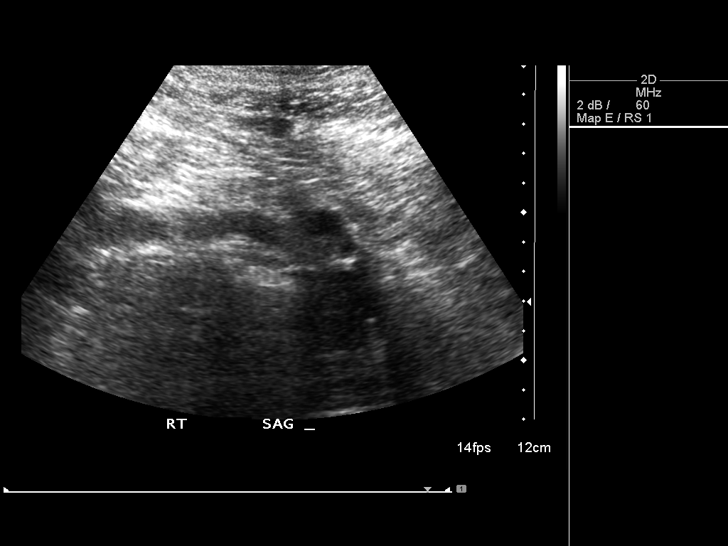
[im 28/34]
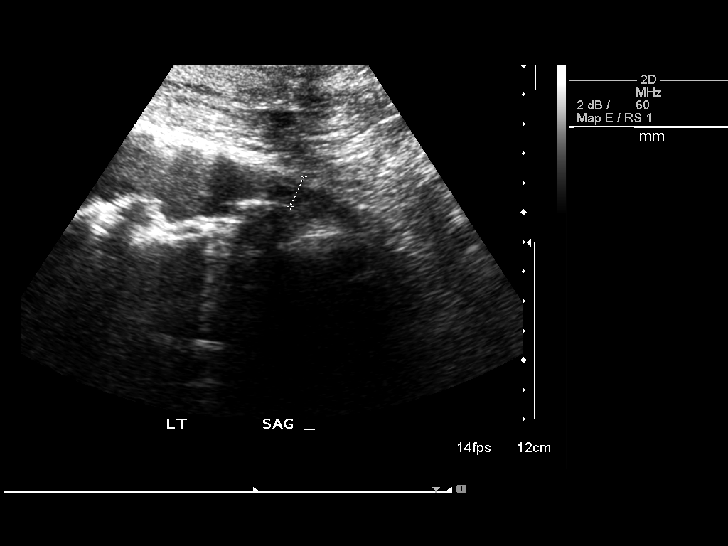
[im 31/34]
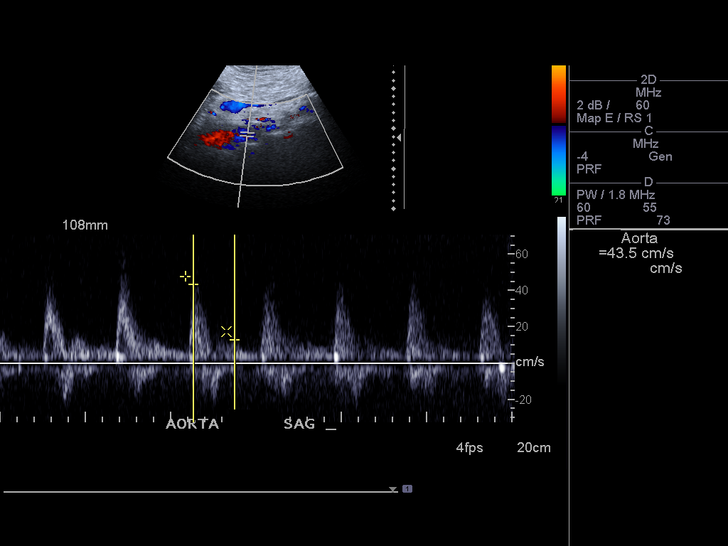
[im 34/34]
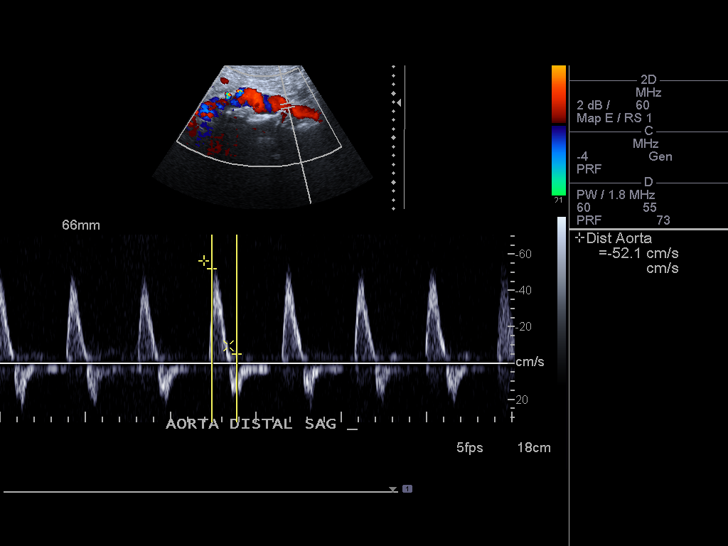

[14 of 25 positions shown; findings below may reference images not displayed]

FINDINGS: Abdominal Aorta

There is atherosclerotic irregularity of the abdominal aorta with an
infrarenal aortic aneurysm.

Maximum AP

Diameter:  4.6 cm.

Maximum TRV

Diameter: 3.6 cm.
IMPRESSION: Infrarenal aortic aneurysm.

## 2016-03-28 DIAGNOSIS — Z23 Encounter for immunization: Secondary | ICD-10-CM | POA: Diagnosis not present

## 2016-03-30 ENCOUNTER — Other Ambulatory Visit: Payer: Self-pay

## 2016-03-30 NOTE — Telephone Encounter (Signed)
Request came for metformin refill.  Message sent to pharmacy that he has an ov with new pcp? Lanelle Bal alexander do. 01/2016 last ov and labs 04/11/2016 next appointment

## 2016-03-31 ENCOUNTER — Other Ambulatory Visit: Payer: Self-pay

## 2016-03-31 MED ORDER — METFORMIN HCL 500 MG PO TABS
1000.0000 mg | ORAL_TABLET | Freq: Two times a day (BID) | ORAL | 2 refills | Status: DC
Start: 2016-03-31 — End: 2016-12-16

## 2016-04-11 ENCOUNTER — Ambulatory Visit (INDEPENDENT_AMBULATORY_CARE_PROVIDER_SITE_OTHER): Payer: Medicare Other | Admitting: Osteopathic Medicine

## 2016-04-11 ENCOUNTER — Encounter: Payer: Self-pay | Admitting: Osteopathic Medicine

## 2016-04-11 VITALS — BP 130/79 | HR 93 | Ht 69.0 in | Wt 192.0 lb

## 2016-04-11 DIAGNOSIS — I10 Essential (primary) hypertension: Secondary | ICD-10-CM

## 2016-04-11 DIAGNOSIS — Z951 Presence of aortocoronary bypass graft: Secondary | ICD-10-CM

## 2016-04-11 DIAGNOSIS — E781 Pure hyperglyceridemia: Secondary | ICD-10-CM

## 2016-04-11 DIAGNOSIS — E782 Mixed hyperlipidemia: Secondary | ICD-10-CM

## 2016-04-11 DIAGNOSIS — I714 Abdominal aortic aneurysm, without rupture, unspecified: Secondary | ICD-10-CM

## 2016-04-11 DIAGNOSIS — E1159 Type 2 diabetes mellitus with other circulatory complications: Secondary | ICD-10-CM
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
## 2016-04-11 NOTE — Patient Instructions (Signed)
PLAN TO COME BACK FOR ANNUAL PHYSICAL IN 3 MONTHS - AT THAT POINT YOU WILL BE DUE FOR ROUTINE LABS AND A1C RECHECK. IF YOU'D LIKE TO GET THAT BLOOD WORK DONE AHEAD OF TIME, YOU MAY COME TO OUR LAB DOWNSTAIRS AT LEAST 1 WEEK PRIOR TO YOUR APPOINTMENT.   IF ANY NEW CONCERNS COME UP IN THE MEANTIME, PLEASE CALL us RIGHT AWAY! IF YOU NEED REFILLS ON PRESCRIPTIONS, PLEASE ASK YOUR PHARMACY TO FORWARD [place] A REFILL REQUEST.

## 2016-06-17 DIAGNOSIS — H353132 Nonexudative age-related macular degeneration, bilateral, intermediate dry stage: Secondary | ICD-10-CM | POA: Diagnosis not present

## 2016-06-17 DIAGNOSIS — E119 Type 2 diabetes mellitus without complications: Secondary | ICD-10-CM | POA: Diagnosis not present

## 2016-06-17 DIAGNOSIS — Z961 Presence of intraocular lens: Secondary | ICD-10-CM | POA: Diagnosis not present

## 2016-06-17 DIAGNOSIS — H40013 Open angle with borderline findings, low risk, bilateral: Secondary | ICD-10-CM | POA: Diagnosis not present

## 2016-06-28 ENCOUNTER — Telehealth: Payer: Self-pay | Admitting: Osteopathic Medicine

## 2016-06-28 DIAGNOSIS — Z0000 Encounter for general adult medical examination without abnormal findings: Secondary | ICD-10-CM

## 2016-06-28 NOTE — Telephone Encounter (Signed)
Pt called.  He has a cpe on 1/29 and would like to come in on 1/19 to have his blood work done.  Thank you.

## 2016-07-01 NOTE — Telephone Encounter (Signed)
Please call patient, orders are in

## 2016-07-05 DIAGNOSIS — Z0000 Encounter for general adult medical examination without abnormal findings: Secondary | ICD-10-CM | POA: Diagnosis not present

## 2016-07-05 DIAGNOSIS — Z1329 Encounter for screening for other suspected endocrine disorder: Secondary | ICD-10-CM | POA: Diagnosis not present

## 2016-07-05 DIAGNOSIS — Z13220 Encounter for screening for lipoid disorders: Secondary | ICD-10-CM | POA: Diagnosis not present

## 2016-07-05 LAB — CBC WITH DIFFERENTIAL/PLATELET
BASOS PCT: 1 %
Basophils Absolute: 76 cells/uL (ref 0–200)
EOS PCT: 2 %
Eosinophils Absolute: 152 cells/uL (ref 15–500)
HCT: 42.7 % (ref 38.5–50.0)
Hemoglobin: 14.5 g/dL (ref 13.2–17.1)
LYMPHS ABS: 2128 {cells}/uL (ref 850–3900)
LYMPHS PCT: 28 %
MCH: 29.5 pg (ref 27.0–33.0)
MCHC: 34 g/dL (ref 32.0–36.0)
MCV: 86.8 fL (ref 80.0–100.0)
MPV: 10.3 fL (ref 7.5–12.5)
Monocytes Absolute: 532 cells/uL (ref 200–950)
Monocytes Relative: 7 %
NEUTROS PCT: 62 %
Neutro Abs: 4712 cells/uL (ref 1500–7800)
Platelets: 197 10*3/uL (ref 140–400)
RBC: 4.92 MIL/uL (ref 4.20–5.80)
RDW: 13.6 % (ref 11.0–15.0)
WBC: 7.6 10*3/uL (ref 3.8–10.8)

## 2016-07-06 LAB — LIPID PANEL
CHOL/HDL RATIO: 5.7 ratio — AB (ref <5.0)
Cholesterol: 159 mg/dL (ref <200)
HDL: 28 mg/dL — AB (ref >40)
Triglycerides: 645 mg/dL — ABNORMAL HIGH (ref <150)

## 2016-07-06 LAB — COMPLETE METABOLIC PANEL WITH GFR
ALT: 17 U/L (ref 9–46)
AST: 22 U/L (ref 10–35)
Albumin: 4.3 g/dL (ref 3.6–5.1)
Alkaline Phosphatase: 71 U/L (ref 40–115)
BUN: 12 mg/dL (ref 7–25)
CHLORIDE: 101 mmol/L (ref 98–110)
CO2: 24 mmol/L (ref 20–31)
Calcium: 9.7 mg/dL (ref 8.6–10.3)
Creat: 0.83 mg/dL (ref 0.70–1.25)
GFR, Est African American: >89 mL/min (ref >=60)
GFR, Est Non African American: >89 mL/min (ref >=60)
GLUCOSE: 213 mg/dL — AB (ref 65–99)
Potassium: 5 mmol/L (ref 3.5–5.3)
SODIUM: 137 mmol/L (ref 135–146)
Total Bilirubin: 0.6 mg/dL (ref 0.2–1.2)
Total Protein: 6.8 g/dL (ref 6.1–8.1)

## 2016-07-06 LAB — TSH: TSH: 1.59 mIU/L (ref 0.40–4.50)

## 2016-07-11 ENCOUNTER — Encounter: Payer: Self-pay | Admitting: Osteopathic Medicine

## 2016-07-11 ENCOUNTER — Ambulatory Visit: Payer: Medicare Other

## 2016-07-11 ENCOUNTER — Ambulatory Visit (INDEPENDENT_AMBULATORY_CARE_PROVIDER_SITE_OTHER): Payer: Medicare Other | Admitting: Osteopathic Medicine

## 2016-07-11 VITALS — BP 102/68 | HR 93 | Ht 69.0 in | Wt 198.0 lb

## 2016-07-11 VITALS — BP 102/68 | HR 93 | Temp 97.9°F | Resp 18 | Ht 69.0 in | Wt 198.1 lb

## 2016-07-11 DIAGNOSIS — E1159 Type 2 diabetes mellitus with other circulatory complications: Secondary | ICD-10-CM

## 2016-07-11 DIAGNOSIS — I2510 Atherosclerotic heart disease of native coronary artery without angina pectoris: Secondary | ICD-10-CM | POA: Diagnosis not present

## 2016-07-11 DIAGNOSIS — Z951 Presence of aortocoronary bypass graft: Secondary | ICD-10-CM

## 2016-07-11 DIAGNOSIS — I714 Abdominal aortic aneurysm, without rupture, unspecified: Secondary | ICD-10-CM

## 2016-07-11 DIAGNOSIS — Z0000 Encounter for general adult medical examination without abnormal findings: Secondary | ICD-10-CM

## 2016-07-11 DIAGNOSIS — I10 Essential (primary) hypertension: Secondary | ICD-10-CM

## 2016-07-11 DIAGNOSIS — E781 Pure hyperglyceridemia: Secondary | ICD-10-CM

## 2016-07-11 LAB — POCT GLYCOSYLATED HEMOGLOBIN (HGB A1C): Hemoglobin A1C: 8.7
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
## 2016-07-11 NOTE — Progress Notes (Signed)
HPI: [name] is a 66 y.o. male  who presents to [facility] today, 07/11/16,  for chief complaint of:  Chief Complaint  Patient presents with  . Annual Exam    Annual exam today. Patient has just completed annual wellness visit per Medicare requirements with our RN. All questions were answered. Declines most preventive care measures.   Discussion with the patient today regarding his goals of care. He states that overall, he is comfortable with his health. Only issues that worry him are the ear abnormality and abdominal aortic aneurysm. He has declined surgery with ENT due to risk of hearing loss. He is currently following with cardiology for AAA  Diabetes: Patient reports less than perfect compliance with diabetic diet. Not doing much in the way of exercise. A1c has been creeping up a bit over the past year or so. He is not desiring to start any new medications.   Cholesterol: Patient did not get labs drawn fasting, triglycerides are quite elevated. Patient declines further adjustment to [facility] medications.    Past medical history, surgical history, social history and family history reviewed.  Patient Active Problem List   Diagnosis Date Noted  . Tobacco abuse 07/25/2012  . CHEST PAIN 02/10/2010  . HYPERTENSION, BENIGN 02/04/2009  . CAD, NATIVE VESSEL 02/04/2009  . AAA (abdominal aortic aneurysm) without rupture ([place]) 02/04/2009  . Diabetes mellitus ([place]) 12/12/2008  . HYPERTRIGLYCERIDEMIA 12/12/2008  . Hypertriglyceridemia 12/12/2008  . GOUT 12/12/2008  . Essential hypertension 12/12/2008  . RENAL INSUFFICIENCY, ACUTE 12/12/2008  . NEPHROLITHIASIS, HX OF 12/12/2008  . S/P CABG x 6 12/12/2008    Current medication list and allergy/intolerance information reviewed.   Current Outpatient Prescriptions on File Prior to Visit  Medication Sig Dispense Refill  . amLODipine (NORVASC) 5 MG tablet TAKE ONE TABLET BY MOUTH ONCE DAILY. 90  tablet 1  . aspirin 325 MG tablet Take 325 mg by mouth daily.    Marland Kitchen atorvastatin (LIPITOR) 40 MG tablet Take 0.5 tablets (20 mg total) by mouth daily. 90 tablet 3  . Blood Glucose Monitoring Suppl (BLOOD GLUCOSE METER KIT AND SUPPLIES) KIT Please check blood sugar twice daily at different times of the day and record. 1 each 0  . glucose blood test strip Test blood sugar twice daily at different times and record. 200 each 3  . indomethacin (INDOCIN) 50 MG capsule TAKE ONE CAPSULE BY MOUTH THREE TIMES DAILY WITH MEALS 30 capsule 2  . Lancets MISC Test blood sugar twice daily at different times and record. 200 each 3  . metFORMIN (GLUCOPHAGE) 500 MG tablet Take 2 tablets (1,000 mg total) by mouth 2 (two) times daily with a meal. 360 tablet 2   No current facility-administered medications on file prior to visit.    Allergies  Allergen Reactions  . Ace Inhibitors     Severe drop in BP per patient  . Colchicine   . Fenofibrate   . Statins     REACTION: severe drop in BP  . Sulfa Antibiotics       Review of Systems:  Constitutional: No recent illness  HEENT: No  headache, no vision change  Cardiac: No  chest pain, No  pressure, No palpitations  Respiratory:  No  shortness of breath. No  Cough  Gastrointestinal: No  abdominal pain  Exam:  BP 102/68   Pulse 93   Ht _0  (1.753 m)   Wt 198 lb (89.8 kg)   BMI 29.24 kg/m  Constitutional: VS see above. General Appearance: alert, well-developed, well-nourished, NAD  Eyes: Normal lids and conjunctive, non-icteric sclera  Ears, Nose, Mouth, Throat: MMM, Normal external inspection ears/nares/mouth/lips/gums.  Neck: No masses, trachea midline.   Respiratory: Normal respiratory effort. no wheeze, no rhonchi, no rales  Cardiovascular: S1/S2 normal, no murmur, no rub/gallop auscultated. RRR.   Musculoskeletal: Gait normal. Symmetric and independent movement of all extremities  Neurological: Normal balance/coordination. No  tremor.  Skin: warm, dry, intact.   Psychiatric: Normal judgment/insight. Normal mood and affect. Oriented x3.    Recent Results (from the past 2160 hour(s))  CBC with Differential/Platelet     Status: None   Collection Time: 07/05/16 11:00 AM  Result Value Ref Range   WBC 7.6 3.8 - 10.8 K/uL   RBC 4.92 4.20 - 5.80 MIL/uL   Hemoglobin 14.5 13.2 - 17.1 g/dL   HCT 42.7 38.5 - 50.0 %   MCV 86.8 80.0 - 100.0 fL   MCH 29.5 27.0 - 33.0 pg   MCHC 34.0 32.0 - 36.0 g/dL   RDW 13.6 11.0 - 15.0 %   Platelets 197 140 - 400 K/uL   MPV 10.3 7.5 - 12.5 fL   Neutro Abs 4,712 1,500 - 7,800 cells/uL   Lymphs Abs 2,128 850 - 3,900 cells/uL   Monocytes Absolute 532 200 - 950 cells/uL   Eosinophils Absolute 152 15 - 500 cells/uL   Basophils Absolute 76 0 - 200 cells/uL   Neutrophils Relative % 62 %   Lymphocytes Relative 28 %   Monocytes Relative 7 %   Eosinophils Relative 2 %   Basophils Relative 1 %   Smear Review Criteria for review not met   COMPLETE METABOLIC PANEL WITH GFR     Status: Abnormal   Collection Time: 07/05/16 11:00 AM  Result Value Ref Range   Sodium 137 135 - 146 mmol/L   Potassium 5.0 3.5 - 5.3 mmol/L   Chloride 101 98 - 110 mmol/L   CO2 24 20 - 31 mmol/L   Glucose, Bld 213 (H) 65 - 99 mg/dL   BUN 12 7 - 25 mg/dL   Creat 0.83 0.70 - 1.25 mg/dL    Comment:   For patients > or = 67 years of age: The upper reference limit for Creatinine is approximately 13% higher for people identified as African-American.      Total Bilirubin 0.6 0.2 - 1.2 mg/dL   Alkaline Phosphatase 71 40 - 115 U/L   AST 22 10 - 35 U/L   ALT 17 9 - 46 U/L   Total Protein 6.8 6.1 - 8.1 g/dL   Albumin 4.3 3.6 - 5.1 g/dL   Calcium 9.7 8.6 - 10.3 mg/dL   GFR, Est African American >89 >=60 mL/min   GFR, Est Non African American >89 >=60 mL/min  TSH     Status: None   Collection Time: 07/05/16 11:00 AM  Result Value Ref Range   TSH 1.59 0.40 - 4.50 mIU/L  Lipid panel     Status: Abnormal    Collection Time: 07/05/16 11:00 AM  Result Value Ref Range   Cholesterol 159 <200 mg/dL   Triglycerides 645 (H) <150 mg/dL   HDL 28 (L) >40 mg/dL   Total CHOL/HDL Ratio 5.7 (H) <5.0 Ratio   VLDL NOT CALC <30 mg/dL    Comment:   Not calculated due to Triglyceride >400. Suggest ordering Direct LDL (Unit Code: [phone]).    LDL Cholesterol NOT CALC <100 mg/dL    Comment:  Not calculated due to Triglyceride >400. Suggest ordering Direct LDL (Unit Code: [phone]).      Depression screen [facility] 2/9 07/11/2016 01/18/2016 07/16/2015  Decreased Interest 0 0 0  Down, Depressed, Hopeless 0 0 0  PHQ - 2 Score 0 0 0      ASSESSMENT/PLAN:   Advised patient he has significant cardiac risk if we do not get the diabetes under better control, as well as the cholesterol. Also advised would discuss with cardiology if high-dose aspirin is advisable, I think maybe given his significant coronary artery bypass surgery but wanted him to discuss risks of bleeding with this. Likely if AAA ruptures, not much lower dose aspirin would do to mitigate the bleeding there which would most likely result in death, but in terms of other GI bleeding risk etc.   Controlled type 2 diabetes mellitus with other circulatory complication, without long-term current use of insulin ([place]) - Plan: POCT HgB A1C  AAA (abdominal aortic aneurysm) without rupture (HCC)  Atherosclerosis of native coronary artery of native heart without angina pectoris  Essential hypertension  HYPERTRIGLYCERIDEMIA  S/P CABG x 6     Follow-up plan: Return in about 3 months (around 10/09/2016) for Diabetes/A1c follow-up.  Visit summary with medication list and pertinent instructions was printed for patient to review, alert [place] if any changes needed. All questions at time of visit were answered - patient instructed to contact office with any additional concerns. ER/RTC precautions were reviewed with the patient and understanding verbalized.   Note: Total  time spent 25 minutes, greater than 50% of the visit was spent face-to-face counseling and coordinating care for the following: The primary encounter diagnosis was Controlled type 2 diabetes mellitus with other circulatory complication, without long-term current use of insulin ([place]). Diagnoses of AAA (abdominal aortic aneurysm) without rupture ([place]), Atherosclerosis of native coronary artery of native heart without angina pectoris, Essential hypertension, HYPERTRIGLYCERIDEMIA, and S/P CABG x 6 were also pertinent to this visit.Marland Kitchen

## 2016-07-11 NOTE — Patient Instructions (Addendum)
Health maintenance:Pneumonia shot, shingles vaccine, diabetic foot exam, colonoscopy.   Abnormal screenings:    Patient concerns: Pt. Denies any concerns at all.   Nurse concerns:Pt. Has no concerns for medical issues such Dm, checking his blood glucose levels at home, getting a colonoscopy, taking his pneumonia vaccine, and other health concerns that are pertinent to his medical diagnoses.  He quoted "I don't care but I am not depressed.  I am going to die of something anyway!"  He declined all of his Gaps in Care and Best Practice Rec.   Next PCP appt:

## 2016-09-20 ENCOUNTER — Other Ambulatory Visit: Payer: Self-pay | Admitting: Emergency Medicine

## 2016-10-13 ENCOUNTER — Other Ambulatory Visit: Payer: Self-pay | Admitting: Osteopathic Medicine

## 2016-11-01 ENCOUNTER — Ambulatory Visit (INDEPENDENT_AMBULATORY_CARE_PROVIDER_SITE_OTHER): Payer: Medicare Other | Admitting: Osteopathic Medicine

## 2016-11-01 ENCOUNTER — Encounter: Payer: Self-pay | Admitting: Osteopathic Medicine

## 2016-11-01 VITALS — BP 138/81 | HR 92 | Ht 69.0 in | Wt 191.0 lb

## 2016-11-01 DIAGNOSIS — I2510 Atherosclerotic heart disease of native coronary artery without angina pectoris: Secondary | ICD-10-CM

## 2016-11-01 DIAGNOSIS — I10 Essential (primary) hypertension: Secondary | ICD-10-CM | POA: Diagnosis not present

## 2016-11-01 DIAGNOSIS — E1159 Type 2 diabetes mellitus with other circulatory complications: Secondary | ICD-10-CM

## 2016-11-01 DIAGNOSIS — E785 Hyperlipidemia, unspecified: Secondary | ICD-10-CM | POA: Diagnosis not present

## 2016-11-01 DIAGNOSIS — E781 Pure hyperglyceridemia: Secondary | ICD-10-CM

## 2016-11-01 LAB — POCT GLYCOSYLATED HEMOGLOBIN (HGB A1C): HEMOGLOBIN A1C: 10.9

## 2016-11-01 MED ORDER — ATORVASTATIN CALCIUM 40 MG PO TABS: 40.0000 mg | ORAL_TABLET | Freq: Every day | ORAL | 3 refills | Status: DC

## 2016-11-01 NOTE — Patient Instructions (Signed)
Carbohydrate Counting for Diabetes Mellitus, Adult Carbohydrate counting is a method for keeping track of how many carbohydrates you eat. Eating carbohydrates naturally increases the amount of sugar (glucose) in the blood. Counting how many carbohydrates you eat helps keep your blood glucose within normal limits, which helps you manage your diabetes (diabetes mellitus). It is important to know how many carbohydrates you can safely have in each meal. This is different for every person. A diet and nutrition specialist (registered dietitian) can help you make a meal plan and calculate how many carbohydrates you should have at each meal and snack. Carbohydrates are found in the following foods:  Grains, such as breads and cereals.  Dried beans and soy products.  Starchy vegetables, such as potatoes, peas, and corn.  Fruit and fruit juices.  Milk and yogurt.  Sweets and snack foods, such as cake, cookies, candy, chips, and soft drinks. How do I count carbohydrates? There are two ways to count carbohydrates in food. You can use either of the methods or a combination of both. Reading "Nutrition Facts" on packaged food  The "Nutrition Facts" list is included on the labels of almost all packaged foods and beverages in the [place] It includes:  The serving size.  Information about nutrients in each serving, including the grams (g) of carbohydrate per serving. To use the "Nutrition Facts":  Decide how many servings you will have.  Multiply the number of servings by the number of carbohydrates per serving.  The resulting number is the total amount of carbohydrates that you will be having. Learning standard serving sizes of other foods  When you eat foods containing carbohydrates that are not packaged or do not include "Nutrition Facts" on the label, you need to measure the servings in order to count the amount of carbohydrates:  Measure the foods that you will eat with a food scale or measuring  cup, if needed.  Decide how many standard-size servings you will eat.  Multiply the number of servings by 15. Most carbohydrate-rich foods have about 15 g of carbohydrates per serving.  For example, if you eat 8 oz (170 g) of strawberries, you will have eaten 2 servings and 30 g of carbohydrates (2 servings x 15 g = 30 g).  For foods that have more than one food mixed, such as soups and casseroles, you must count the carbohydrates in each food that is included. The following list contains standard serving sizes of common carbohydrate-rich foods. Each of these servings has about 15 g of carbohydrates:   hamburger bun or  English muffin.   oz (15 mL) syrup.   oz (14 g) jelly.  1 slice of bread.  1 six-inch tortilla.  3 oz (85 g) cooked rice or pasta.  4 oz (113 g) cooked dried beans.  4 oz (113 g) starchy vegetable, such as peas, corn, or potatoes.  4 oz (113 g) hot cereal.  4 oz (113 g) mashed potatoes or  of a large baked potato.  4 oz (113 g) canned or frozen fruit.  4 oz (120 mL) fruit juice.  4-6 crackers.  6 chicken nuggets.  6 oz (170 g) unsweetened dry cereal.  6 oz (170 g) plain fat-free yogurt or yogurt sweetened with artificial sweeteners.  8 oz (240 mL) milk.  8 oz (170 g) fresh fruit or one small piece of fruit.  24 oz (680 g) popped popcorn. Example of carbohydrate counting Sample meal   3 oz (85 g) chicken breast.  6  oz (170 g) brown rice.  4 oz (113 g) corn.  8 oz (240 mL) milk.  8 oz (170 g) strawberries with sugar-free whipped topping. Carbohydrate calculation  1. Identify the foods that contain carbohydrates:  Rice.  Corn.  Milk.  Strawberries. 2. Calculate how many servings you have of each food:  2 servings rice.  1 serving corn.  1 serving milk.  1 serving strawberries. 3. Multiply each number of servings by 15 g:  2 servings rice x 15 g = 30 g.  1 serving corn x 15 g = 15 g.  1 serving milk x 15 g = 15  g.  1 serving strawberries x 15 g = 15 g. 4. Add together all of the amounts to find the total grams of carbohydrates eaten:  30 g + 15 g + 15 g + 15 g = 75 g of carbohydrates total. This information is not intended to replace advice given to you by your health care provider. Make sure you discuss any questions you have with your health care provider. Document Released: 05/30/2005 Document Revised: 12/18/2015 Document Reviewed: 11/11/2015 Elsevier Interactive Patient Education  2017 [place].   Diabetes Mellitus and Exercise Exercising regularly is important for your overall health, especially when you have diabetes (diabetes mellitus). Exercising is not only about losing weight. It has many health benefits, such as increasing muscle strength and bone density and reducing body fat and stress. This leads to improved fitness, flexibility, and endurance, all of which result in better overall health. Exercise has additional benefits for people with diabetes, including:  Reducing appetite.  Helping to lower and control blood glucose.  Lowering blood pressure.  Helping to control amounts of fatty substances (lipids) in the blood, such as cholesterol and triglycerides.  Helping the body to respond better to insulin (improving insulin sensitivity).  Reducing how much insulin the body needs.  Decreasing the risk for heart disease by:  Lowering cholesterol and triglyceride levels.  Increasing the levels of good cholesterol.  Lowering blood glucose levels. What is my activity plan? Your health care provider or certified diabetes educator can help you make a plan for the type and frequency of exercise (activity plan) that works for you. Make sure that you:  Do at least 150 minutes of moderate-intensity or vigorous-intensity exercise each week. This could be brisk walking, biking, or water aerobics.  Do stretching and strength exercises, such as yoga or weightlifting, at least 2 times  a week.  Spread out your activity over at least 3 days of the week.  Get some form of physical activity every day.  Do not go more than 2 days in a row without some kind of physical activity.  Avoid being inactive for more than 90 minutes at a time. Take frequent breaks to walk or stretch.  Choose a type of exercise or activity that you enjoy, and set realistic goals.  Start slowly, and gradually increase the intensity of your exercise over time. What do I need to know about managing my diabetes?  Check your blood glucose before and after exercising.  If your blood glucose is higher than 240 mg/dL (13.3 mmol/L) before you exercise, check your urine for ketones. If you have ketones in your urine, do not exercise until your blood glucose returns to normal.  Know the symptoms of low blood glucose (hypoglycemia) and how to treat it. Your risk for hypoglycemia increases during and after exercise. Common symptoms of hypoglycemia can include:  Hunger.  Anxiety.  Sweating and feeling clammy.  Confusion.  Dizziness or feeling light-headed.  Increased heart rate or palpitations.  Blurry vision.  Tingling or numbness around the mouth, lips, or tongue.  Tremors or shakes.  Irritability.  Keep a rapid-acting carbohydrate snack available before, during, and after exercise to help prevent or treat hypoglycemia.  Avoid injecting insulin into areas of the body that are going to be exercised. For example, avoid injecting insulin into:  The arms, when playing tennis.  The legs, when jogging.  Keep records of your exercise habits. Doing this can help you and your health care provider adjust your diabetes management plan as needed. Write down:  Food that you eat before and after you exercise.  Blood glucose levels before and after you exercise.  The type and amount of exercise you have done.  When your insulin is expected to peak, if you use insulin. Avoid exercising at times when  your insulin is peaking.  When you start a new exercise or activity, work with your health care provider to make sure the activity is safe for you, and to adjust your insulin, medicines, or food intake as needed.  Drink plenty of water while you exercise to prevent dehydration or heat stroke. Drink enough fluid to keep your urine clear or pale yellow. This information is not intended to replace advice given to you by your health care provider. Make sure you discuss any questions you have with your health care provider. Document Released: 08/20/2003 Document Revised: 12/18/2015 Document Reviewed: 11/09/2015 Elsevier Interactive Patient Education  2017 [employer].

## 2016-11-01 NOTE — Progress Notes (Signed)
HPI: [name] is a 66 y.o. male  who presents to [facility] today, 11/01/16,  for chief complaint of:  Chief Complaint  Patient presents with  . Follow-up    DIABETES    Diabetes: Patient is taking metformin as directed, A1c is significantly elevated today compared to previous measurements. Patient has been resistant to addition of any medications. He states that his A1c has been greater than 10 in the past, he was on insulin at that point but was able to come off of this with significant weight loss. Patient states he avoids bread by has dessert almost every night, wife typically bakes a lot of desserts. He is consuming carbs typically for breakfast in the form of oatmeal or cereal, he is not usually adding sugar, he drinks some soft drinks. He does not have a formal exercise regimen though he is fairly active  Hypertension: Less than 140/90, again reluctant to adjust medications at this point.  Hypertriglyceridemia: History of pancreatitis and markedly elevated triglyceride levels. Patient is open to increasing dose of atorvastatin but not doing much else in the way of starting another medication such as fenofibrate.   Past medical history, surgical history, social history and family history reviewed.  Patient Active Problem List   Diagnosis Date Noted  . Tobacco abuse 07/25/2012  . CHEST PAIN 02/10/2010  . HYPERTENSION, BENIGN 02/04/2009  . CAD, NATIVE VESSEL 02/04/2009  . AAA (abdominal aortic aneurysm) without rupture ([place]) 02/04/2009  . Diabetes mellitus ([place]) 12/12/2008  . HYPERTRIGLYCERIDEMIA 12/12/2008  . Hypertriglyceridemia 12/12/2008  . GOUT 12/12/2008  . Essential hypertension 12/12/2008  . RENAL INSUFFICIENCY, ACUTE 12/12/2008  . NEPHROLITHIASIS, HX OF 12/12/2008  . S/P CABG x 6 12/12/2008    Current medication list and allergy/intolerance information reviewed.   Current Outpatient Prescriptions on File Prior to Visit   Medication Sig Dispense Refill  . amLODipine (NORVASC) 5 MG tablet TAKE ONE TABLET BY MOUTH ONCE DAILY. 90 tablet 0  . aspirin 325 MG tablet Take 325 mg by mouth daily.    Marland Kitchen atorvastatin (LIPITOR) 40 MG tablet Take 0.5 tablets (20 mg total) by mouth daily. 90 tablet 3  . Blood Glucose Monitoring Suppl (BLOOD GLUCOSE METER KIT AND SUPPLIES) KIT Please check blood sugar twice daily at different times of the day and record. 1 each 0  . glucose blood test strip Test blood sugar twice daily at different times and record. 200 each 3  . indomethacin (INDOCIN) 50 MG capsule TAKE 1 CAPSULE BY MOUTH THREE TIMES A DAY WITH FOOD 30 capsule 1  . Lancets MISC Test blood sugar twice daily at different times and record. 200 each 3  . metFORMIN (GLUCOPHAGE) 500 MG tablet Take 2 tablets (1,000 mg total) by mouth 2 (two) times daily with a meal. 360 tablet 2   No current facility-administered medications on file prior to visit.    Allergies  Allergen Reactions  . Ace Inhibitors     Severe drop in BP per patient  . Colchicine   . Fenofibrate   . Statins     REACTION: severe drop in BP  . Sulfa Antibiotics       Review of Systems:  Constitutional: No recent illness  HEENT: No  headache  Cardiac: No  chest pain, No  pressure  Respiratory:  No  shortness of breath. No   Gastrointestinal: No  abdominal pain, no change in bowel habits  Neurologic: No  weakness, No  Dizziness  Exam:  BP 138/81   Pulse 92   Ht '5\' 9"'$  (1.753 m)   Wt 191 lb (86.6 kg)   BMI 28.21 kg/m   Constitutional: VS see above. General Appearance: alert, well-developed, well-nourished, NAD  Eyes: Normal lids and conjunctive, non-icteric sclera  Ears, Nose, Mouth, Throat: MMM, Normal external inspection ears/nares/mouth/lips/gums.  Neck: No masses, trachea midline.   Respiratory: Normal respiratory effort. no wheeze, no rhonchi, no rales  Cardiovascular: S1/S2 normal, no murmur, no rub/gallop auscultated. RRR.    Musculoskeletal: Gait normal. Symmetric and independent movement of all extremities  Neurological: Normal balance/coordination. No tremor.  Skin: warm, dry, intact.   Psychiatric: Fair judgment/insight. Normal mood and affect. Oriented x3.     Results for orders placed or performed in visit on 11/01/16 (from the past 24 hour(s))  POCT HgB A1C     Status: None   Collection Time: 11/01/16  8:12 AM  Result Value Ref Range   Hemoglobin A1C 10.9      ASSESSMENT/PLAN:   Diabetes mellitus ([place]) - Long conversation about lifestyle modifications. Patient declines additional medications/insulin at this time. - Plan: POCT HgB A1C, Hemoglobin A1c. Advised patient that if A1c does not show dramatic improvement with diligent lifestyle modifications, or if it is getting worse, I will not negotiate as far as insulin is concerned we will start him on long-acting insulin at next visit unless A1c is markedly better.   Essential hypertension - Keep an eye on blood pressure, going up a bit from last visit  Hyperlipidemia, unspecified hyperlipidemia type - Recheck lipids, get direct LDL - Plan: Lipid panel, Direct LDL  Hypertriglyceridemia - Declines further intervention, discussed lifestyle modifications - Plan: Lipid panel, Direct LDL  Atherosclerosis of native coronary artery of native heart without angina pectoris - History of coronary artery bypass graft, diabetic poorly controlled, hyperlipidemia poorly controlled     Follow-up plan: Return in about 3 months (around 02/01/2017) for follow-up A1C and cholesterol, sooner if needed.  Visit summary with medication list and pertinent instructions was printed for patient to review, alert [place] if any changes needed. All questions at time of visit were answered - patient instructed to contact office with any additional concerns. ER/RTC precautions were reviewed with the patient and understanding verbalized.   Note: Total time spent 25 minutes, greater  than 50% of the visit was spent face-to-face counseling and coordinating care for the following: The primary encounter diagnosis was Diabetes mellitus without complication ([place]). Diagnoses of Essential hypertension, Hyperlipidemia, unspecified hyperlipidemia type, Hypertriglyceridemia, and Atherosclerosis of native coronary artery of native heart without angina pectoris were also pertinent to this visit.Marland Kitchen

## 2016-12-16 ENCOUNTER — Other Ambulatory Visit: Payer: Self-pay | Admitting: Emergency Medicine

## 2016-12-22 ENCOUNTER — Other Ambulatory Visit: Payer: Self-pay | Admitting: Osteopathic Medicine

## 2017-01-18 DIAGNOSIS — E781 Pure hyperglyceridemia: Secondary | ICD-10-CM | POA: Diagnosis not present

## 2017-01-18 DIAGNOSIS — E785 Hyperlipidemia, unspecified: Secondary | ICD-10-CM | POA: Diagnosis not present

## 2017-01-18 DIAGNOSIS — E119 Type 2 diabetes mellitus without complications: Secondary | ICD-10-CM | POA: Diagnosis not present

## 2017-01-19 LAB — HEMOGLOBIN A1C
Hgb A1c MFr Bld: 9.8 % — ABNORMAL HIGH (ref <5.7)
MEAN PLASMA GLUCOSE: 235 mg/dL

## 2017-01-19 LAB — LIPID PANEL
Cholesterol: 203 mg/dL — ABNORMAL HIGH (ref <200)
HDL: 30 mg/dL — AB (ref >40)
Total CHOL/HDL Ratio: 6.8 Ratio — ABNORMAL HIGH (ref <5.0)
Triglycerides: 1094 mg/dL — ABNORMAL HIGH (ref <150)

## 2017-01-19 LAB — LDL CHOLESTEROL, DIRECT: LDL DIRECT: 37 mg/dL (ref <100)

## 2017-01-31 ENCOUNTER — Telehealth: Payer: Self-pay

## 2017-01-31 ENCOUNTER — Encounter: Payer: Self-pay | Admitting: Osteopathic Medicine

## 2017-01-31 ENCOUNTER — Ambulatory Visit (INDEPENDENT_AMBULATORY_CARE_PROVIDER_SITE_OTHER): Payer: Medicare Other | Admitting: Osteopathic Medicine

## 2017-01-31 VITALS — BP 112/76 | HR 83 | Temp 97.7°F | Resp 18 | Wt 200.0 lb

## 2017-01-31 DIAGNOSIS — E1159 Type 2 diabetes mellitus with other circulatory complications: Secondary | ICD-10-CM | POA: Diagnosis not present

## 2017-01-31 DIAGNOSIS — I10 Essential (primary) hypertension: Secondary | ICD-10-CM | POA: Diagnosis not present

## 2017-01-31 DIAGNOSIS — R5383 Other fatigue: Secondary | ICD-10-CM | POA: Diagnosis not present

## 2017-01-31 DIAGNOSIS — E781 Pure hyperglyceridemia: Secondary | ICD-10-CM | POA: Diagnosis not present

## 2017-01-31 DIAGNOSIS — E785 Hyperlipidemia, unspecified: Secondary | ICD-10-CM

## 2017-01-31 MED ORDER — EMPAGLIFLOZIN 25 MG PO TABS: 25.0000 mg | ORAL_TABLET | Freq: Every day | ORAL | 1 refills | Status: DC

## 2017-01-31 MED ORDER — ICOSAPENT ETHYL 1 G PO CAPS: 2.0000 | ORAL_CAPSULE | Freq: Two times a day (BID) | ORAL | 11 refills | Status: DC

## 2017-01-31 NOTE — Patient Instructions (Signed)
Labs in 2 weeks on new medicines  Otherwise, recheck sugars in 3 months in the office  Let me know if any problems before then!

## 2017-01-31 NOTE — Progress Notes (Signed)
HPI: [name] is a 66 y.o. male  who presents to [facility] today, 01/31/17,  for chief complaint of:  Chief Complaint  Patient presents with  . Diabetes    Diabetes: Patient is taking metformin as directed, A1c is Recently improved compared to previous measurements - 3 months ago was 10.9 and currently 9.8. Patient has been resistant to addition of any medications. He states that his A1c has been greater than 10 in the past, he was on insulin at that point but was able to come off of this with significant weight loss. Patient states he avoids bread but has dessert almost every night. He does not have a formal exercise regimen though he is fairly active  Hypertension: Blood pressure actually looks pretty good today, no chest pain or pressure, shortness of breath.  Hypertriglyceridemia: History of pancreatitis and markedly elevated triglyceride levels. Patient was open to increasing dose of atorvastatin at last visit but not doing much else in the way of starting another medication. Given that the triglycerides have increased a good bit, he is open to trying medications.     Past medical history, surgical history, social history and family history reviewed.  Patient Active Problem List   Diagnosis Date Noted  . Tobacco abuse 07/25/2012  . HYPERTENSION, BENIGN 02/04/2009  . CAD, NATIVE VESSEL 02/04/2009  . AAA (abdominal aortic aneurysm) without rupture ([place]) 02/04/2009  . Diabetes mellitus ([place]) 12/12/2008  . HYPERTRIGLYCERIDEMIA 12/12/2008  . Hypertriglyceridemia 12/12/2008  . GOUT 12/12/2008  . Essential hypertension 12/12/2008  . RENAL INSUFFICIENCY, ACUTE 12/12/2008  . NEPHROLITHIASIS, HX OF 12/12/2008  . S/P CABG x 6 12/12/2008    Current medication list and allergy/intolerance information reviewed.   Current Outpatient Prescriptions on File Prior to Visit  Medication Sig Dispense Refill  . amLODipine (NORVASC) 5 MG tablet TAKE  ONE TABLET BY MOUTH ONCE DAILY. 90 tablet 0  . aspirin 325 MG tablet Take 325 mg by mouth daily.    Marland Kitchen atorvastatin (LIPITOR) 40 MG tablet Take 1 tablet (40 mg total) by mouth daily. 90 tablet 3  . Blood Glucose Monitoring Suppl (BLOOD GLUCOSE METER KIT AND SUPPLIES) KIT Please check blood sugar twice daily at different times of the day and record. 1 each 0  . glucose blood test strip Test blood sugar twice daily at different times and record. 200 each 3  . indomethacin (INDOCIN) 50 MG capsule TAKE 1 CAPSULE BY MOUTH THREE TIMES A DAY WITH FOOD 30 capsule 1  . Lancets MISC Test blood sugar twice daily at different times and record. 200 each 3  . metFORMIN (GLUCOPHAGE) 500 MG tablet TAKE 2 TABLETS (1,000 MG TOTAL) BY MOUTH 2 (TWO) TIMES DAILY WITH A MEAL. 360 tablet 3   No current facility-administered medications on file prior to visit.    Allergies  Allergen Reactions  . Ace Inhibitors     Severe drop in BP per patient  . Colchicine   . Fenofibrate   . Statins     REACTION: severe drop in BP  . Sulfa Antibiotics       Review of Systems:  Constitutional: No recent illness  HEENT: No  headache  Cardiac: No  chest pain, No  pressure  Respiratory:  No  shortness of breath. No   Gastrointestinal: No  abdominal pain, no change in bowel habits  Neurologic: No  weakness, No  Dizziness    Exam:  BP 112/76   Pulse 83  Temp 97.7 F (36.5 C) (Oral)   Resp 18   Wt 200 lb (90.7 kg)   SpO2 97%   BMI 29.53 kg/m   Constitutional: VS see above. General Appearance: alert, well-developed, well-nourished, NAD  Eyes: Normal lids and conjunctive, non-icteric sclera  Ears, Nose, Mouth, Throat: MMM, Normal external inspection ears/nares/mouth/lips/gums.  Neck: No masses, trachea midline.   Respiratory: Normal respiratory effort. no wheeze, no rhonchi, no rales  Cardiovascular: S1/S2 normal, no murmur, no rub/gallop auscultated. RRR.   Musculoskeletal: Gait normal. Symmetric and  independent movement of all extremities  Neurological: Normal balance/coordination. No tremor.  Skin: warm, dry, intact.   Psychiatric: Fair judgment/insight. Normal mood and affect. Oriented x3.      ASSESSMENT/PLAN: Discussed importance of getting control over sugars and triglyceride levels given his cardiac risk factors. Stressed lifestyle modifications.  Type 2 diabetes mellitus with other circulatory complication, without long-term current use of insulin ([place]) - Adding Jardiance  - Plan: CBC, COMPLETE METABOLIC PANEL WITH GFR, Lipid panel, empagliflozin (JARDIANCE) 25 MG TABS tablet  Essential hypertension - Plan: CBC, COMPLETE METABOLIC PANEL WITH GFR, Lipid panel  Hyperlipidemia, unspecified hyperlipidemia type - Plan: Lipid panel  Hypertriglyceridemia - Plan: Lipid panel, Icosapent Ethyl (VASCEPA) 1 g CAPS  Fatigue, unspecified type - Plan: COMPLETE METABOLIC PANEL WITH GFR, TSH     Follow-up plan: Return in about 3 months (around 05/03/2017) for recheck diabetes.  Visit summary with medication list and pertinent instructions was printed for patient to review, alert [place] if any changes needed. All questions at time of visit were answered - patient instructed to contact office with any additional concerns. ER/RTC precautions were reviewed with the patient and understanding verbalized.   Note: Total time spent 25 minutes, greater than 50% of the visit was spent face-to-face counseling and coordinating care for the following: The primary encounter diagnosis was Type 2 diabetes mellitus with other circulatory complication, without long-term current use of insulin ([place]). Diagnoses of Essential hypertension, Hyperlipidemia, unspecified hyperlipidemia type, Hypertriglyceridemia, and Fatigue, unspecified type were also pertinent to this visit.Marland Kitchen

## 2017-01-31 NOTE — Telephone Encounter (Signed)
Patient called stated that the medication that was sent in for him today will cost him $300. He stated that he can not pay that and he wants to know if there is something else that can called in to him. Please advise. Tecla Mailloux,CMA

## 2017-01-31 NOTE — Telephone Encounter (Signed)
I called in two different medications for him, which one is he concerned about? Can we call pharmacy, may require prior authorization for these medicines, patient was advised that a PA may be necessary.

## 2017-02-02 NOTE — Telephone Encounter (Signed)
Spoke to patient he stated that both medications was too high for him. One was $200 and the other was $100. Will call [facility] to iniate a PA. Rhonda Cunningham,CMA

## 2017-02-03 NOTE — Telephone Encounter (Signed)
Okay, we will see if we can get anywhere with the prior authorization.

## 2017-02-21 NOTE — Telephone Encounter (Signed)
How are we doing on prior authorization?

## 2017-02-21 NOTE — Telephone Encounter (Signed)
Sorry I have not been working on [employer] since I have been on floor jardiance $135 for 90 days and $45 for 1 month, Vacepa is $190 for 90 days. These are just what it cost for him. There are not auths required

## 2017-03-26 ENCOUNTER — Other Ambulatory Visit: Payer: Self-pay | Admitting: Osteopathic Medicine

## 2017-04-10 DIAGNOSIS — Z23 Encounter for immunization: Secondary | ICD-10-CM | POA: Diagnosis not present

## 2017-05-16 ENCOUNTER — Encounter: Payer: Self-pay | Admitting: Osteopathic Medicine

## 2017-05-16 ENCOUNTER — Ambulatory Visit (INDEPENDENT_AMBULATORY_CARE_PROVIDER_SITE_OTHER): Payer: Medicare Other | Admitting: Osteopathic Medicine

## 2017-05-16 VITALS — BP 125/70 | HR 97 | Wt 190.0 lb

## 2017-05-16 DIAGNOSIS — E785 Hyperlipidemia, unspecified: Secondary | ICD-10-CM | POA: Diagnosis not present

## 2017-05-16 DIAGNOSIS — I10 Essential (primary) hypertension: Secondary | ICD-10-CM | POA: Diagnosis not present

## 2017-05-16 DIAGNOSIS — E781 Pure hyperglyceridemia: Secondary | ICD-10-CM

## 2017-05-16 DIAGNOSIS — I2510 Atherosclerotic heart disease of native coronary artery without angina pectoris: Secondary | ICD-10-CM | POA: Diagnosis not present

## 2017-05-16 DIAGNOSIS — E1159 Type 2 diabetes mellitus with other circulatory complications: Secondary | ICD-10-CM

## 2017-05-16 DIAGNOSIS — Z951 Presence of aortocoronary bypass graft: Secondary | ICD-10-CM | POA: Diagnosis not present

## 2017-05-16 NOTE — Progress Notes (Signed)
HPI: [name] is a 66 y.o. male  who presents to [place] today, 05/16/17,  for chief complaint of:  Chief Complaint  Patient presents with  . Diabetes    Diabetes: Patient is taking metformin as directed, A1c last visit was improved compared to previous measurements - 6 months ago was 10.9 and last visit 9.8. Patient has been resistant to addition of any medications - we tried a few additions last visit but cost was prohibitive for him. He states that his A1c has been greater than 10 in the past, he was on insulin at that point but was able to come off of this with significant weight loss. Patient states he avoids bread but has dessert almost every night. He does not have a formal exercise regimen though he is fairly active. A1C unable to be run on PC lab today, Hgb reported high on the machine   Hypertension: Blood pressure looks pretty good today, no chest pain or pressure, shortness of breath.  Hypertriglyceridemia: History of pancreatitis and markedly elevated triglyceride levels. Patient was open to increasing dose of atorvastatin at last visit but not doing much else in the way of starting another medication. Vascepa was too expensive.      Past medical history, surgical history, social history and family history reviewed.  Patient Active Problem List   Diagnosis Date Noted  . Tobacco abuse 07/25/2012  . HYPERTENSION, BENIGN 02/04/2009  . CAD, NATIVE VESSEL 02/04/2009  . AAA (abdominal aortic aneurysm) without rupture ([place]) 02/04/2009  . Diabetes mellitus ([place]) 12/12/2008  . HYPERTRIGLYCERIDEMIA 12/12/2008  . Hypertriglyceridemia 12/12/2008  . GOUT 12/12/2008  . Essential hypertension 12/12/2008  . NEPHROLITHIASIS, HX OF 12/12/2008  . S/P CABG x 6 12/12/2008    Current medication list and allergy/intolerance information reviewed.   Current Outpatient Medications on File Prior to Visit  Medication Sig Dispense Refill  .  amLODipine (NORVASC) 5 MG tablet TAKE ONE TABLET BY MOUTH ONCE DAILY. 90 tablet 0  . aspirin 325 MG tablet Take 325 mg by mouth daily.    Marland Kitchen atorvastatin (LIPITOR) 40 MG tablet Take 1 tablet (40 mg total) by mouth daily. 90 tablet 3  . Blood Glucose Monitoring Suppl (BLOOD GLUCOSE METER KIT AND SUPPLIES) KIT Please check blood sugar twice daily at different times of the day and record. 1 each 0  . empagliflozin (JARDIANCE) 25 MG TABS tablet Take 25 mg by mouth daily. 90 tablet 1  . glucose blood test strip Test blood sugar twice daily at different times and record. 200 each 3  . Icosapent Ethyl (VASCEPA) 1 g CAPS Take 2 capsules by mouth 2 (two) times daily. 120 capsule 11  . indomethacin (INDOCIN) 50 MG capsule TAKE 1 CAPSULE BY MOUTH THREE TIMES A DAY WITH FOOD 30 capsule 1  . Lancets MISC Test blood sugar twice daily at different times and record. 200 each 3  . metFORMIN (GLUCOPHAGE) 500 MG tablet TAKE 2 TABLETS (1,000 MG TOTAL) BY MOUTH 2 (TWO) TIMES DAILY WITH A MEAL. 360 tablet 3   No current facility-administered medications on file prior to visit.    Allergies  Allergen Reactions  . Ace Inhibitors     Severe drop in BP per patient  . Colchicine   . Fenofibrate   . Statins     REACTION: severe drop in BP  . Sulfa Antibiotics       Review of Systems:  Constitutional: No recent illness  HEENT: No  headache  Cardiac: No  chest pain, No  pressure  Respiratory:  No  shortness of breath. No   Gastrointestinal: No  abdominal pain, no change in bowel habits  Neurologic: No  weakness, No  Dizziness    Exam:  BP 125/70   Pulse 97   Wt 190 lb (86.2 kg)   SpO2 97%   BMI 28.06 kg/m   Constitutional: VS see above. General Appearance: alert, well-developed, well-nourished, NAD  Eyes: Normal lids and conjunctive, non-icteric sclera  Ears, Nose, Mouth, Throat: MMM, Normal external inspection ears/nares/mouth/lips/gums.  Neck: No masses, trachea midline.   Respiratory:  Normal respiratory effort. no wheeze, no rhonchi, no rales  Cardiovascular: S1/S2 normal, no murmur, no rub/gallop auscultated. RRR.   Musculoskeletal: Gait normal. Symmetric and independent movement of all extremities  Neurological: Normal balance/coordination. No tremor.  Skin: warm, dry, intact.   Psychiatric: Fair judgment/insight. Normal mood and affect. Oriented x3.    No results found for this or any previous visit (from the past 72 hour(s)).     ASSESSMENT/PLAN: Discussed importance of getting control over sugars and triglyceride levels given his cardiac risk factors. Stressed lifestyle modifications. If A1C not dramatically better, will need to   Type 2 diabetes mellitus with other circulatory complication, without long-term current use of insulin ([place]) - Plan: CBC, COMPLETE METABOLIC PANEL WITH GFR, Hemoglobin A1c  Essential hypertension - Plan: CBC, COMPLETE METABOLIC PANEL WITH GFR  Hyperlipidemia, unspecified hyperlipidemia type - Plan: CBC, COMPLETE METABOLIC PANEL WITH GFR, Lipid panel  S/P CABG x 6 - needs cardio f/u, has had phon eissues w/ their office, I sent a message to our clinic staff to look into this - Plan: COMPLETE METABOLIC PANEL WITH GFR, Lipid panel  Hypertriglyceridemia - Plan: CBC, COMPLETE METABOLIC PANEL WITH GFR, Lipid panel  Atherosclerosis of native coronary artery of native heart without angina pectoris - Plan: COMPLETE METABOLIC PANEL WITH GFR, Lipid panel  Patient Instructions  For Diabetes - if we need to start Insulin  Continue Metformin as you are taking it  Measure fasting blood sugar every day: goal for now is to get this to 80-130  Start at 10 units insulin, once per day  Increase daily Insulin dose by 2 units at a time, twice per week, until fasting sugars are consistently 80-130, then continue at that insulin dose  Plan to recheck A1C in 3 months  Plan to follow-up in the office sooner if you experience low sugars   Plan  to follow-up in the office sooner if your fasting sugars are consistently high  Bring all sugar readings with you to your office visits   Might be helpful to contact your insurance company and ask them to send yo ua list of their formulary for diabetes and cholesterol medications.      Follow-up plan: Return in about 3 months (around 08/14/2017) for recheck sugars, sooner if needed.  Visit summary with medication list and pertinent instructions was printed for patient to review, alert [place] if any changes needed. All questions at time of visit were answered - patient instructed to contact office with any additional concerns. ER/RTC precautions were reviewed with the patient and understanding verbalized.   Note: Total time spent 25 minutes, greater than 50% of the visit was spent face-to-face counseling and coordinating care for the following: The primary encounter diagnosis was Type 2 diabetes mellitus with other circulatory complication, without long-term current use of insulin ([place]). Diagnoses of Essential hypertension, Hyperlipidemia, unspecified hyperlipidemia type, S/P CABG  x 6, Hypertriglyceridemia, and Atherosclerosis of native coronary artery of native heart without angina pectoris were also pertinent to this visit.Marland Kitchen

## 2017-05-16 NOTE — Patient Instructions (Signed)
For Diabetes - if we need to start Insulin  Continue Metformin as you are taking it  Measure fasting blood sugar every day: goal for now is to get this to 80-130  Start at 10 units insulin, once per day  Increase daily Insulin dose by 2 units at a time, twice per week, until fasting sugars are consistently 80-130, then continue at that insulin dose  Plan to recheck A1C in 3 months  Plan to follow-up in the office sooner if you experience low sugars   Plan to follow-up in the office sooner if your fasting sugars are consistently high  Bring all sugar readings with you to your office visits   Might be helpful to contact your insurance company and ask them to send yo ua list of their formulary for diabetes and cholesterol medications.

## 2017-05-17 ENCOUNTER — Other Ambulatory Visit: Payer: Self-pay | Admitting: Osteopathic Medicine

## 2017-05-17 LAB — LIPID PANEL
Cholesterol: 144 mg/dL (ref <200)
HDL: 26 mg/dL — ABNORMAL LOW (ref >40)
NON-HDL CHOLESTEROL (CALC): 118 mg/dL (ref <130)
TRIGLYCERIDES: 704 mg/dL — AB (ref <150)
Total CHOL/HDL Ratio: 5.5 (calc) — ABNORMAL HIGH (ref <5.0)

## 2017-05-17 LAB — CBC
HEMATOCRIT: 40.7 % (ref 38.5–50.0)
Hemoglobin: 14.3 g/dL (ref 13.2–17.1)
MCH: 29.2 pg (ref 27.0–33.0)
MCHC: 35.1 g/dL (ref 32.0–36.0)
MCV: 83.2 fL (ref 80.0–100.0)
MPV: 10.4 fL (ref 7.5–12.5)
PLATELETS: 228 10*3/uL (ref 140–400)
RBC: 4.89 10*6/uL (ref 4.20–5.80)
RDW: 13 % (ref 11.0–15.0)
WBC: 7.7 10*3/uL (ref 3.8–10.8)

## 2017-05-17 LAB — COMPLETE METABOLIC PANEL WITH GFR
AG RATIO: 1.7 (calc) (ref 1.0–2.5)
ALKALINE PHOSPHATASE (APISO): 105 U/L (ref 40–115)
ALT: 12 U/L (ref 9–46)
AST: 15 U/L (ref 10–35)
Albumin: 4.3 g/dL (ref 3.6–5.1)
BILIRUBIN TOTAL: 0.5 mg/dL (ref 0.2–1.2)
BUN: 14 mg/dL (ref 7–25)
CO2: 23 mmol/L (ref 20–32)
Calcium: 9.5 mg/dL (ref 8.6–10.3)
Chloride: 103 mmol/L (ref 98–110)
Creat: 0.88 mg/dL (ref 0.70–1.25)
GFR, EST AFRICAN AMERICAN: 103 mL/min/{1.73_m2} (ref >=60)
GFR, EST NON AFRICAN AMERICAN: 89 mL/min/{1.73_m2} (ref >=60)
GLOBULIN: 2.6 g/dL (ref 1.9–3.7)
Glucose, Bld: 294 mg/dL — ABNORMAL HIGH (ref 65–99)
POTASSIUM: 4.5 mmol/L (ref 3.5–5.3)
Sodium: 138 mmol/L (ref 135–146)
Total Protein: 6.9 g/dL (ref 6.1–8.1)

## 2017-05-17 LAB — HEMOGLOBIN A1C
Hgb A1c MFr Bld: 10.2 % of total Hgb — ABNORMAL HIGH (ref <5.7)
MEAN PLASMA GLUCOSE: 246 (calc)
eAG (mmol/L): 13.6 (calc)

## 2017-05-17 MED ORDER — INSULIN DEGLUDEC 100 UNIT/ML ~~LOC~~ SOPN
10.0000 [IU] | PEN_INJECTOR | Freq: Every evening | SUBCUTANEOUS | 3 refills | Status: DC
Start: 2017-05-17 — End: 2017-09-18

## 2017-05-17 NOTE — Progress Notes (Signed)
"

## 2017-05-18 ENCOUNTER — Telehealth: Payer: Self-pay | Admitting: Cardiology

## 2017-05-18 ENCOUNTER — Other Ambulatory Visit: Payer: Self-pay

## 2017-05-18 DIAGNOSIS — I714 Abdominal aortic aneurysm, without rupture, unspecified: Secondary | ICD-10-CM

## 2017-05-18 DIAGNOSIS — E1159 Type 2 diabetes mellitus with other circulatory complications: Secondary | ICD-10-CM

## 2017-05-18 MED ORDER — INSULIN PEN NEEDLE 32G X 4 MM MISC: 1 refills | Status: DC

## 2017-05-18 NOTE — Telephone Encounter (Signed)
New Message     Patient is calling he said its been 2 years since he had is aorta checked and would like you to schedule test

## 2017-05-18 NOTE — Telephone Encounter (Signed)
Spoke with pt, order placed and he was given the number to [place] [facility] [place] to call and schedule.

## 2017-06-02 ENCOUNTER — Ambulatory Visit (HOSPITAL_BASED_OUTPATIENT_CLINIC_OR_DEPARTMENT_OTHER)
Admission: RE | Admit: 2017-06-02 | Discharge: 2017-06-02 | Disposition: A | Discharge status: Home / Self Care | Payer: Medicare Other | Source: Ambulatory Visit | Attending: Cardiology | Admitting: Cardiology

## 2017-06-02 DIAGNOSIS — I714 Abdominal aortic aneurysm, without rupture, unspecified: Secondary | ICD-10-CM

## 2017-06-02 NOTE — Progress Notes (Signed)
Echocardiogram 2D Echocardiogram has been performed.  [name] 06/02/2017, 11:10 AM

## 2017-06-08 ENCOUNTER — Other Ambulatory Visit: Payer: Self-pay | Admitting: *Deleted

## 2017-06-08 DIAGNOSIS — I714 Abdominal aortic aneurysm, without rupture, unspecified: Secondary | ICD-10-CM

## 2017-06-19 ENCOUNTER — Other Ambulatory Visit: Payer: Self-pay

## 2017-06-19 MED ORDER — AMLODIPINE BESYLATE 5 MG PO TABS: 5.0000 mg | ORAL_TABLET | Freq: Every day | ORAL | 0 refills | Status: DC

## 2017-06-21 DIAGNOSIS — H353131 Nonexudative age-related macular degeneration, bilateral, early dry stage: Secondary | ICD-10-CM | POA: Diagnosis not present

## 2017-06-21 DIAGNOSIS — E119 Type 2 diabetes mellitus without complications: Secondary | ICD-10-CM | POA: Diagnosis not present

## 2017-06-21 DIAGNOSIS — Z961 Presence of intraocular lens: Secondary | ICD-10-CM | POA: Diagnosis not present

## 2017-06-21 DIAGNOSIS — H40013 Open angle with borderline findings, low risk, bilateral: Secondary | ICD-10-CM | POA: Diagnosis not present

## 2017-06-21 LAB — HM DIABETES EYE EXAM

## 2017-06-26 ENCOUNTER — Encounter: Payer: Self-pay | Admitting: Osteopathic Medicine
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
## 2017-07-27 ENCOUNTER — Other Ambulatory Visit: Payer: Self-pay

## 2017-07-27 DIAGNOSIS — E1159 Type 2 diabetes mellitus with other circulatory complications: Secondary | ICD-10-CM

## 2017-07-27 MED ORDER — GLUCOSE BLOOD VI STRP: ORAL_STRIP | 3 refills | Status: DC

## 2017-07-27 MED ORDER — LANCETS MISC: 3 refills | Status: DC

## 2017-07-27 NOTE — Telephone Encounter (Signed)
Patient called in requesting diabetic supplies refill: Blood glucose strips and lancets for One Touch Ultra. LOV: 05/16/2017 3 month f/u - 08/14/17 at 9:50 am.

## 2017-08-02 ENCOUNTER — Encounter: Payer: Self-pay | Admitting: Cardiology

## 2017-08-02 ENCOUNTER — Ambulatory Visit: Payer: Medicare Other | Admitting: Cardiology

## 2017-08-02 VITALS — BP 132/76 | HR 72 | Ht 69.0 in | Wt 203.4 lb

## 2017-08-02 DIAGNOSIS — I714 Abdominal aortic aneurysm, without rupture, unspecified: Secondary | ICD-10-CM

## 2017-08-02 DIAGNOSIS — E7800 Pure hypercholesterolemia, unspecified: Secondary | ICD-10-CM

## 2017-08-02 DIAGNOSIS — Z951 Presence of aortocoronary bypass graft: Secondary | ICD-10-CM

## 2017-08-02 DIAGNOSIS — I10 Essential (primary) hypertension: Secondary | ICD-10-CM

## 2017-08-02 NOTE — Patient Instructions (Signed)
Medication Instructions:  Your physician recommends that you continue on your current medications as directed. Please refer to the Current Medication list given to you today.  Labwork: -None  Testing/Procedures: -None  Follow-Up: Your physician wants you to follow-up in: 1 year with Dr. Crenshaw. You will receive a reminder letter in the mail two months in advance. If you don't receive a letter, please call our office to schedule the follow-up appointment.   Any Other Special Instructions Will Be Listed Below (If Applicable).     If you need a refill on your cardiac medications before your next appointment, please call your pharmacy.   

## 2017-08-14 ENCOUNTER — Ambulatory Visit (INDEPENDENT_AMBULATORY_CARE_PROVIDER_SITE_OTHER): Payer: Medicare Other | Admitting: Osteopathic Medicine

## 2017-08-14 ENCOUNTER — Encounter: Payer: Self-pay | Admitting: Osteopathic Medicine

## 2017-08-14 VITALS — BP 128/72 | HR 72 | Temp 97.8°F | Wt 204.1 lb

## 2017-08-14 DIAGNOSIS — E782 Mixed hyperlipidemia: Secondary | ICD-10-CM | POA: Diagnosis not present

## 2017-08-14 DIAGNOSIS — E1159 Type 2 diabetes mellitus with other circulatory complications: Secondary | ICD-10-CM

## 2017-08-14 DIAGNOSIS — Z951 Presence of aortocoronary bypass graft: Secondary | ICD-10-CM

## 2017-08-14 DIAGNOSIS — E781 Pure hyperglyceridemia: Secondary | ICD-10-CM

## 2017-08-14 DIAGNOSIS — I10 Essential (primary) hypertension: Secondary | ICD-10-CM

## 2017-08-14 DIAGNOSIS — Z794 Long term (current) use of insulin: Secondary | ICD-10-CM

## 2017-08-14 LAB — COMPLETE METABOLIC PANEL WITH GFR
AG Ratio: 2.1 (calc) (ref 1.0–2.5)
ALT: 19 U/L (ref 9–46)
AST: 21 U/L (ref 10–35)
Albumin: 5 g/dL (ref 3.6–5.1)
Alkaline phosphatase (APISO): 75 U/L (ref 40–115)
BUN: 14 mg/dL (ref 7–25)
CALCIUM: 9.8 mg/dL (ref 8.6–10.3)
CO2: 23 mmol/L (ref 20–32)
Chloride: 107 mmol/L (ref 98–110)
Creat: 0.86 mg/dL (ref 0.70–1.25)
GFR, EST AFRICAN AMERICAN: 104 mL/min/{1.73_m2} (ref >=60)
GFR, Est Non African American: 90 mL/min/{1.73_m2} (ref >=60)
Globulin: 2.4 g/dL (calc) (ref 1.9–3.7)
Glucose, Bld: 102 mg/dL — ABNORMAL HIGH (ref 65–99)
Potassium: 4.6 mmol/L (ref 3.5–5.3)
Sodium: 141 mmol/L (ref 135–146)
TOTAL PROTEIN: 7.4 g/dL (ref 6.1–8.1)
Total Bilirubin: 0.8 mg/dL (ref 0.2–1.2)

## 2017-08-14 LAB — LIPID PANEL
Cholesterol: 110 mg/dL (ref <200)
HDL: 33 mg/dL — ABNORMAL LOW (ref >40)
LDL Cholesterol (Calc): 49 mg/dL (calc)
Non-HDL Cholesterol (Calc): 77 mg/dL (calc) (ref <130)
TRIGLYCERIDES: 215 mg/dL — AB (ref <150)
Total CHOL/HDL Ratio: 3.3 (calc) (ref <5.0)

## 2017-08-14 LAB — POCT GLYCOSYLATED HEMOGLOBIN (HGB A1C): HEMOGLOBIN A1C: 6.6

## 2017-08-14 MED ORDER — INSULIN GLARGINE 300 UNIT/ML ~~LOC~~ SOPN: 70.0000 [IU] | PEN_INJECTOR | Freq: Every day | SUBCUTANEOUS | 11 refills | Status: DC

## 2017-08-14 MED ORDER — GLUCOSE 5 G PO CHEW: 15.0000 g | CHEWABLE_TABLET | ORAL | 12 refills | Status: DC | PRN

## 2017-08-14 NOTE — Progress Notes (Signed)
HPI: [name] is a 67 y.o. male  who presents to [facility] today, 08/14/17,  for chief complaint of:  Diabetes recheck    Diabetes: A1c is had been consistently pretty high, we given our best shot with metformin/lifestyle changes that at last visit A1c was 10.2 and I advise getting back on insulin. Patient is here today for follow-up. A1c today is looking a lot better. He brings his sugar readings with him, typical fasting levels have gone down into the 100s up to occasional 140s. Fasting level today was 89. He definitely notes a difference when he has a late dinner or evening time snack. He works in [employer], has been unable to do much through the winter due to weather. Worries about blood sugar dropping when weather gets nicer and he is able to move around a bit more. He is doing okay on the [place] but injections are occasionally painful, he is up to almost 90 units at a time.  Hypertension: Blood pressure looks pretty good today, no chest pain or pressure, shortness of breath.  Hypertriglyceridemia: History of pancreatitis and markedly elevated triglyceride levels, at last check in December 2018, triglycerides were down from 1094 to 704, patient was advised to restart his fish oil. He is on atorvastatin 40 mg daily. Vascepa was too expensive.      Past medical history, surgical history, social history and family history reviewed.  Patient Active Problem List   Diagnosis Date Noted  . Tobacco abuse 07/25/2012  . HYPERTENSION, BENIGN 02/04/2009  . CAD, NATIVE VESSEL 02/04/2009  . AAA (abdominal aortic aneurysm) without rupture ([place]) 02/04/2009  . Diabetes mellitus ([place]) 12/12/2008  . HYPERTRIGLYCERIDEMIA 12/12/2008  . Hypertriglyceridemia 12/12/2008  . GOUT 12/12/2008  . Essential hypertension 12/12/2008  . NEPHROLITHIASIS, HX OF 12/12/2008  . S/P CABG x 6 12/12/2008    Current medication list and allergy/intolerance information  reviewed.   Current Outpatient Medications on File Prior to Visit  Medication Sig Dispense Refill  . amLODipine (NORVASC) 5 MG tablet Take 1 tablet (5 mg total) by mouth daily. 90 tablet 0  . aspirin 325 MG tablet Take 325 mg by mouth daily.    Marland Kitchen atorvastatin (LIPITOR) 40 MG tablet Take 1 tablet (40 mg total) by mouth daily. 90 tablet 3  . Blood Glucose Monitoring Suppl (BLOOD GLUCOSE METER KIT AND SUPPLIES) KIT Please check blood sugar twice daily at different times of the day and record. 1 each 0  . glucose blood test strip Test blood sugar twice daily at different times and record. 200 each 3  . indomethacin (INDOCIN) 50 MG capsule TAKE 1 CAPSULE BY MOUTH THREE TIMES A DAY WITH FOOD 30 capsule 1  . insulin degludec (TRESIBA) 100 UNIT/ML SOPN FlexTouch Pen Inject 0.1 mLs (10 Units total) into the skin every evening. Increase by 2 units every 2 days to target fasting blood glucose 80-120 15 mL 3  . Insulin Pen Needle (BD PEN NEEDLE NANO U/F) 32G X 4 MM MISC Use to inject insulin daily. 100 each 1  . Lancets MISC Test blood sugar twice daily at different times and record. 200 each 3  . metFORMIN (GLUCOPHAGE) 500 MG tablet TAKE 2 TABLETS (1,000 MG TOTAL) BY MOUTH 2 (TWO) TIMES DAILY WITH A MEAL. 360 tablet 3   No current facility-administered medications on file prior to visit.    Allergies  Allergen Reactions  . Ace Inhibitors     Severe drop in BP per patient  .  Colchicine   . Fenofibrate   . Statins     REACTION: severe drop in BP  . Sulfa Antibiotics       Review of Systems:  Constitutional: No recent illness  HEENT: No  headache  Cardiac: No  chest pain, No  pressure  Respiratory:  No  shortness of breath. No   Gastrointestinal: No  abdominal pain, no change in bowel habits  Neurologic: No  weakness, No  Dizziness    Exam:  BP 128/72   Pulse 72   Temp 97.8 F (36.6 C) (Oral)   Wt 204 lb 1.9 oz (92.6 kg)   BMI 30.14 kg/m   Constitutional: VS see above. General  Appearance: alert, well-developed, well-nourished, NAD  Eyes: Normal lids and conjunctive, non-icteric sclera  Ears, Nose, Mouth, Throat: MMM, Normal external inspection ears/nares/mouth/lips/gums.  Neck: No masses, trachea midline.   Respiratory: Normal respiratory effort. no wheeze, no rhonchi, no rales  Cardiovascular: S1/S2 normal, no murmur, no rub/gallop auscultated. RRR.   Musculoskeletal: Gait normal. Symmetric and independent movement of all extremities  Neurological: Normal balance/coordination. No tremor.  Skin: warm, dry, intact.   Psychiatric: Fair judgment/insight. Normal mood and affect. Oriented x3.    No results found for this or any previous visit (from the past 72 hour(s)).     ASSESSMENT/PLAN: Discussed importance of getting control over sugars and triglyceride levels given his cardiac risk factors. Stressed lifestyle modifications. Will switch to Tuesday oh insulin, hopefully this will be smaller droplet subcutaneous and more comfortable for him and if we need to increase the units a bit more but will be a little easier for him. Would back down to 60 or 70 units to start and see how he does. Advised have glucose tablets and when she/candy on hand when he is working in case blood sugar drops.  Type 2 diabetes mellitus with other circulatory complication, with long-term current use of insulin ([facility]) - Plan: COMPLETE METABOLIC PANEL WITH GFR, Lipid panel  Hypertriglyceridemia - Plan: COMPLETE METABOLIC PANEL WITH GFR, Lipid panel  Essential hypertension  Mixed hyperlipidemia  S/P CABG x 6  Patient Instructions  Plan:  Switch from Tresiba to higher-concentration Toujeo, which should absorb better!   Will try starting at 70 units daily of Toujeo   Goal fasting 100-130's or 140s now and then   Can just check the AM fasting sugar, or check as needed if feeling sick or like sugars are low      Follow-up plan: Return in about 4 weeks (around 09/11/2017)  for review blood sugars, see how doing on the [place]. Recheck A1C in 3 months .  Visit summary with medication list and pertinent instructions was printed for patient to review, alert us if any changes needed. All questions at time of visit were answered - patient instructed to contact office with any additional concerns. ER/RTC precautions were reviewed with the patient and understanding verbalized.   Note: Total time spent 25 minutes, greater than 50% of the visit was spent face-to-face counseling and coordinating care for the following: The primary encounter diagnosis was Type 2 diabetes mellitus with other circulatory complication, with long-term current use of insulin ([facility]). Diagnoses of Hypertriglyceridemia, Essential hypertension, Mixed hyperlipidemia, and S/P CABG x 6 were also pertinent to this visit..  

## 2017-08-14 NOTE — Patient Instructions (Addendum)
Plan:  Switch from [place] to higher-concentration Toujeo, which should absorb better!   Will try starting at 70 units daily of Toujeo   Goal fasting 100-130's or 140s now and then   Can just check the AM fasting sugar, or check as needed if feeling sick or like sugars are low

## 2017-08-14 NOTE — Addendum Note (Signed)
Addended by: Mertha Finders on: 08/14/2017 10:40 AM   Modules accepted: Orders

## 2017-08-30 ENCOUNTER — Other Ambulatory Visit: Payer: Self-pay | Admitting: Osteopathic Medicine

## 2017-09-11 ENCOUNTER — Ambulatory Visit: Payer: Self-pay | Admitting: Osteopathic Medicine

## 2017-09-12 ENCOUNTER — Other Ambulatory Visit: Payer: Self-pay | Admitting: Osteopathic Medicine

## 2017-09-18 ENCOUNTER — Ambulatory Visit (INDEPENDENT_AMBULATORY_CARE_PROVIDER_SITE_OTHER): Payer: Medicare Other | Admitting: Osteopathic Medicine

## 2017-09-18 ENCOUNTER — Encounter: Payer: Self-pay | Admitting: Osteopathic Medicine

## 2017-09-18 VITALS — BP 128/67 | HR 83 | Temp 98.2°F | Wt 208.0 lb

## 2017-09-18 DIAGNOSIS — Z951 Presence of aortocoronary bypass graft: Secondary | ICD-10-CM

## 2017-09-18 DIAGNOSIS — I10 Essential (primary) hypertension: Secondary | ICD-10-CM | POA: Diagnosis not present

## 2017-09-18 DIAGNOSIS — E1159 Type 2 diabetes mellitus with other circulatory complications: Secondary | ICD-10-CM

## 2017-09-18 MED ORDER — INSULIN GLARGINE 300 UNIT/ML ~~LOC~~ SOPN: 70.0000 [IU] | PEN_INJECTOR | Freq: Every day | SUBCUTANEOUS | 11 refills | Status: DC

## 2017-09-18 NOTE — Progress Notes (Signed)
HPI: [name] is a 67 y.o. male  who presents to [place] today, 09/18/17,  for chief complaint of:  Diabetes recheck    Diabetes: A1c had been consistently pretty high, we given our best shot with metformin/lifestyle changes that when A1c was 10.2 I advised getting back on insulin. A1c looked a lot better. Home readings: typical fasting levels have gone down into the 100s up to occasional 140s. He definitely notes a difference when he has a late dinner or evening time snack. He works in [employer], has been unable to do much through the winter due to weather. Worries about blood sugar dropping when weather gets nicer and he is able to move around a bit more. He was doing okay on the [place] but injections are occasionally painful, he was up to almost 90 units at a time. We decided to switch to Toujeo starting at 60 units or so and see how he did with this. He has been taking 70 units of the Toujeo, not titrating up on this.Fasting blood sugar levels have been in  Hypertension: Blood pressure looks pretty good today, no chest pain or pressure, shortness of breath.  Hypertriglyceridemia: History of pancreatitis and markedly elevated triglyceride levels, at last check in December 2018, triglycerides were down from 1094 to 704, patient was advised to restart his fish oil. He is on atorvastatin 40 mg daily. Vascepa was too expensive.      Past medical history, surgical history, social history and family history reviewed.  Patient Active Problem List   Diagnosis Date Noted  . Tobacco abuse 07/25/2012  . HYPERTENSION, BENIGN 02/04/2009  . CAD, NATIVE VESSEL 02/04/2009  . AAA (abdominal aortic aneurysm) without rupture ([place]) 02/04/2009  . Diabetes mellitus ([place]) 12/12/2008  . HYPERTRIGLYCERIDEMIA 12/12/2008  . Hypertriglyceridemia 12/12/2008  . GOUT 12/12/2008  . Essential hypertension 12/12/2008  . NEPHROLITHIASIS, HX OF 12/12/2008  . S/P CABG x  6 12/12/2008    Current medication list and allergy/intolerance information reviewed.   Current Outpatient Medications on File Prior to Visit  Medication Sig Dispense Refill  . amLODipine (NORVASC) 5 MG tablet TAKE 1 TABLET BY MOUTH EVERY DAY 90 tablet 0  . aspirin 325 MG tablet Take 325 mg by mouth daily.    Marland Kitchen atorvastatin (LIPITOR) 40 MG tablet Take 1 tablet (40 mg total) by mouth daily. 90 tablet 3  . Blood Glucose Monitoring Suppl (BLOOD GLUCOSE METER KIT AND SUPPLIES) KIT Please check blood sugar twice daily at different times of the day and record. 1 each 0  . glucose 5 g chewable tablet Chew 3 tablets (15 g total) by mouth as needed for low blood sugar. 50 tablet 12  . glucose blood test strip Test blood sugar twice daily at different times and record. 200 each 3  . indomethacin (INDOCIN) 50 MG capsule TAKE 1 CAPSULE BY MOUTH THREE TIMES A DAY WITH FOOD 30 capsule 1  . Insulin Glargine (TOUJEO MAX SOLOSTAR) 300 UNIT/ML SOPN Inject 70 Units into the skin daily. 15 pen 11  . Insulin Pen Needle (BD PEN NEEDLE NANO U/F) 32G X 4 MM MISC Use to inject insulin daily. 100 each 1  . Lancets MISC Test blood sugar twice daily at different times and record. 200 each 3  . metFORMIN (GLUCOPHAGE) 500 MG tablet TAKE 2 TABLETS (1,000 MG TOTAL) BY MOUTH 2 (TWO) TIMES DAILY WITH A MEAL. 360 tablet 3  . insulin degludec (TRESIBA) 100 UNIT/ML SOPN FlexTouch Pen  Inject 0.1 mLs (10 Units total) into the skin every evening. Increase by 2 units every 2 days to target fasting blood glucose 80-120 (Patient not taking: Reported on 09/18/2017) 15 mL 3   No current facility-administered medications on file prior to visit.    Allergies  Allergen Reactions  . Ace Inhibitors     Severe drop in BP per patient  . Colchicine   . Fenofibrate   . Statins     REACTION: severe drop in BP  . Sulfa Antibiotics       Review of Systems:  Constitutional: No recent illness  HEENT: No  headache  Cardiac: No  chest  pain, No  pressure  Respiratory:  No  shortness of breath. No   Gastrointestinal: No  abdominal pain, no change in bowel habits  Neurologic: No  weakness, No  Dizziness    Exam:  BP 128/67 (BP Location: Left Arm, Patient Position: Sitting, Cuff Size: Normal)   Pulse 83   Temp 98.2 F (36.8 C) (Oral)   Wt 208 lb (94.3 kg)   BMI 30.72 kg/m   Constitutional: VS see above. General Appearance: alert, well-developed, well-nourished, NAD  Eyes: Normal lids and conjunctive, non-icteric sclera  Ears, Nose, Mouth, Throat: MMM, Normal external inspection ears/nares/mouth/lips/gums.  Neck: No masses, trachea midline.   Respiratory: Normal respiratory effort. no wheeze, no rhonchi, no rales  Cardiovascular: S1/S2 normal, no murmur, no rub/gallop auscultated. RRR.   Musculoskeletal: Gait normal. Symmetric and independent movement of all extremities  Neurological: Normal balance/coordination. No tremor.  Skin: warm, dry, intact.   Psychiatric: Fair judgment/insight. Normal mood and affect. Oriented x3.       ASSESSMENT/PLAN:   Type 2 diabetes mellitus with other circulatory complication, without long-term current use of insulin (HCC) - e will titrate up on medicines, hypoglycemia precautions reviewed. No episodes thus far  S/P CABG x 6 - on statin, aspirin. Intolerant to ace inhibitors  HYPERTENSION, BENIGN - well controlled   Meds ordered this encounter  Medications  . Insulin Glargine (TOUJEO MAX SOLOSTAR) 300 UNIT/ML SOPN    Sig: Inject 70 Units into the skin daily. Increase by 3-5 units twice per week to target fasting blood glucose 90-130    Dispense:  15 pen    Refill:  11      Follow-up plan: Return in about 2 months (around 11/18/2017) for recheck A1C .  Visit summary with medication list and pertinent instructions was printed for patient to review, alert [place] if any changes needed. All questions at time of visit were answered - patient instructed to contact office  with any additional concerns. ER/RTC precautions were reviewed with the patient and understanding verbalized.   Note: Total time spent 25 minutes, greater than 50% of the visit was spent face-to-face counseling and coordinating care for the following: The primary encounter diagnosis was Type 2 diabetes mellitus with other circulatory complication, without long-term current use of insulin ([place]). Diagnoses of S/P CABG x 6 and HYPERTENSION, BENIGN were also pertinent to this visit.Marland Kitchen

## 2017-10-09 ENCOUNTER — Other Ambulatory Visit: Payer: Self-pay | Admitting: Osteopathic Medicine

## 2017-10-09 DIAGNOSIS — E1159 Type 2 diabetes mellitus with other circulatory complications: Secondary | ICD-10-CM

## 2017-10-14 ENCOUNTER — Other Ambulatory Visit: Payer: Self-pay | Admitting: Osteopathic Medicine

## 2017-11-13 ENCOUNTER — Ambulatory Visit: Payer: Medicare Other | Admitting: Osteopathic Medicine

## 2017-11-13 ENCOUNTER — Encounter: Payer: Self-pay | Admitting: Osteopathic Medicine

## 2017-11-13 VITALS — BP 121/81 | HR 92 | Temp 98.7°F | Wt 208.6 lb

## 2017-11-13 DIAGNOSIS — E785 Hyperlipidemia, unspecified: Secondary | ICD-10-CM | POA: Diagnosis not present

## 2017-11-13 DIAGNOSIS — E781 Pure hyperglyceridemia: Secondary | ICD-10-CM

## 2017-11-13 DIAGNOSIS — Z794 Long term (current) use of insulin: Secondary | ICD-10-CM | POA: Diagnosis not present

## 2017-11-13 DIAGNOSIS — I10 Essential (primary) hypertension: Secondary | ICD-10-CM | POA: Diagnosis not present

## 2017-11-13 DIAGNOSIS — Z951 Presence of aortocoronary bypass graft: Secondary | ICD-10-CM

## 2017-11-13 DIAGNOSIS — E1159 Type 2 diabetes mellitus with other circulatory complications: Secondary | ICD-10-CM | POA: Diagnosis not present

## 2017-11-13 LAB — POCT GLYCOSYLATED HEMOGLOBIN (HGB A1C): Hemoglobin A1C: 7.5 % — AB (ref 4.0–5.6)

## 2017-11-13 NOTE — Progress Notes (Signed)
HPI: [name] is a 67 y.o. male  who presents to [facility] today, 11/13/17,  for chief complaint of:  Diabetes recheck    Diabetes: A1c had been consistently pretty high, we given our best shot with metformin/lifestyle changes but when A1c was 10.2 in 05/2017, I advised getting back on insulin. A1c looked a lot better 08/2017 at 6.6%. Worries about blood sugar dropping when weather gets nicer and he is able to move around a bit more. He was doing okay on the [place] but injections are occasionally painful, he was up to almost 90 units at a time. We decided to switch to Toujeo starting at 60 units or so and see how he did with this. He has been taking 70 units of the Toujeo, not finding a need to titrate up on this. Due today 11/13/17 for A1C recheck, is 7.5% - no hypoglycemia, he's taking 80 units Toujeo daily, could be better about diet.   Hypertension: Blood pressure looks pretty good today, no chest pain or pressure, shortness of breath.  Hypertriglyceridemia: History of pancreatitis and markedly elevated triglyceride levels, at last check in December 2018, triglycerides were down from 1094 to 704, patient was advised to restart his fish oil. He is on atorvastatin 40 mg daily. Vascepa was too expensive. Last check 08/2016 TG was 215. LDL was fine at 49.      Past medical history, surgical history, social history and family history reviewed.  Patient Active Problem List   Diagnosis Date Noted  . Tobacco abuse 07/25/2012  . HYPERTENSION, BENIGN 02/04/2009  . CAD, NATIVE VESSEL 02/04/2009  . AAA (abdominal aortic aneurysm) without rupture ([place]) 02/04/2009  . Diabetes mellitus ([place]) 12/12/2008  . HYPERTRIGLYCERIDEMIA 12/12/2008  . Hypertriglyceridemia 12/12/2008  . GOUT 12/12/2008  . Essential hypertension 12/12/2008  . NEPHROLITHIASIS, HX OF 12/12/2008  . S/P CABG x 6 12/12/2008    Current medication list and allergy/intolerance  information reviewed.   Current Outpatient Medications on File Prior to Visit  Medication Sig Dispense Refill  . amLODipine (NORVASC) 5 MG tablet TAKE 1 TABLET BY MOUTH EVERY DAY 90 tablet 0  . aspirin 325 MG tablet Take 325 mg by mouth daily.    Marland Kitchen atorvastatin (LIPITOR) 40 MG tablet TAKE 1 TABLET BY MOUTH EVERY DAY 90 tablet 3  . Blood Glucose Monitoring Suppl (BLOOD GLUCOSE METER KIT AND SUPPLIES) KIT Please check blood sugar twice daily at different times of the day and record. 1 each 0  . glucose 5 g chewable tablet Chew 3 tablets (15 g total) by mouth as needed for low blood sugar. 50 tablet 12  . glucose blood test strip Test blood sugar twice daily at different times and record. 200 each 3  . indomethacin (INDOCIN) 50 MG capsule TAKE 1 CAPSULE BY MOUTH THREE TIMES A DAY WITH FOOD 30 capsule 1  . Insulin Glargine (TOUJEO MAX SOLOSTAR) 300 UNIT/ML SOPN Inject 70 Units into the skin daily. Increase by 3-5 units twice per week to target fasting blood glucose 90-130 15 pen 11  . Insulin Pen Needle (BD PEN NEEDLE NANO U/F) 32G X 4 MM MISC USE TO INJECT INSULIN DAILY. 100 each 1  . Lancets MISC Test blood sugar twice daily at different times and record. 200 each 3  . metFORMIN (GLUCOPHAGE) 500 MG tablet TAKE 2 TABLETS (1,000 MG TOTAL) BY MOUTH 2 (TWO) TIMES DAILY WITH A MEAL. 360 tablet 3   No current facility-administered medications on file  prior to visit.    Allergies  Allergen Reactions  . Ace Inhibitors     Severe drop in BP per patient  . Colchicine   . Fenofibrate   . Statins     REACTION: severe drop in BP  . Sulfa Antibiotics       Review of Systems:  Constitutional: No recent illness  HEENT: No  headache  Cardiac: No  chest pain, No  pressure  Respiratory:  No  shortness of breath. No   Gastrointestinal: No  abdominal pain, no change in bowel habits  Neurologic: No  weakness, No  Dizziness    Exam:  BP 121/81 (BP Location: Left Arm, Patient Position: Sitting,  Cuff Size: Normal)   Pulse 92   Temp 98.7 F (37.1 C) (Oral)   Wt 208 lb 9.6 oz (94.6 kg)   BMI 30.80 kg/m   Constitutional: VS see above. General Appearance: alert, well-developed, well-nourished, NAD  Eyes: Normal lids and conjunctive, non-icteric sclera  Ears, Nose, Mouth, Throat: MMM, Normal external inspection ears/nares/mouth/lips/gums.  Neck: No masses, trachea midline.   Respiratory: Normal respiratory effort. no wheeze, no rhonchi, no rales  Cardiovascular: S1/S2 normal, no murmur, no rub/gallop auscultated. RRR.   Musculoskeletal: Gait normal. Symmetric and independent movement of all extremities  Neurological: Normal balance/coordination. No tremor.  Skin: warm, dry, intact.   Psychiatric: Fair judgment/insight. Normal mood and affect. Oriented x3.   Results for orders placed or performed in visit on 11/13/17 (from the past 24 hour(s))  POCT HgB A1C     Status: Abnormal   Collection Time: 11/13/17  9:45 AM  Result Value Ref Range   Hemoglobin A1C 7.5 (A) 4.0 - 5.6 %   HbA1c, POC (prediabetic range)  5.7 - 6.4 %   HbA1c, POC (controlled diabetic range)  0.0 - 7.0 %       ASSESSMENT/PLAN: The primary encounter diagnosis was Type 2 diabetes mellitus with other circulatory complication, with long-term current use of insulin ([place]). Diagnoses of S/P CABG x 6, Hypertriglyceridemia, Essential hypertension, and Hyperlipidemia, unspecified hyperlipidemia type were also pertinent to this visit.  Pt feels well today. I think we can leave things as-is for now as far as meds. Ideally would like A1C closer to 7% - work on diet and will consider 10 units short-acting insulin at meal times if A1C starts creeping closer to 8%.       Follow-up plan: Return in about 3 months (around 02/13/2018) for recheck A1C/diabetes, see me sooner if needed.  Visit summary with medication list and pertinent instructions was printed for patient to review, alert [place] if any changes needed. All  questions at time of visit were answered - patient instructed to contact office with any additional concerns. ER/RTC precautions were reviewed with the patient and understanding verbalized.   Note: Total time spent 25 minutes, greater than 50% of the visit was spent face-to-face counseling and coordinating care for the above diagnoses.

## 2018-01-10 ENCOUNTER — Other Ambulatory Visit: Payer: Self-pay | Admitting: Osteopathic Medicine

## 2018-02-13 DIAGNOSIS — E1159 Type 2 diabetes mellitus with other circulatory complications: Secondary | ICD-10-CM | POA: Diagnosis not present

## 2018-02-13 DIAGNOSIS — E119 Type 2 diabetes mellitus without complications: Secondary | ICD-10-CM | POA: Diagnosis not present

## 2018-02-13 DIAGNOSIS — Z794 Long term (current) use of insulin: Secondary | ICD-10-CM | POA: Diagnosis not present

## 2018-02-16 LAB — CBC
HEMATOCRIT: 38.7 % (ref 38.5–50.0)
HEMOGLOBIN: 13.4 g/dL (ref 13.2–17.1)
MCH: 29 pg (ref 27.0–33.0)
MCHC: 34.6 g/dL (ref 32.0–36.0)
MCV: 83.8 fL (ref 80.0–100.0)
MPV: 10.5 fL (ref 7.5–12.5)
Platelets: 236 10*3/uL (ref 140–400)
RBC: 4.62 10*6/uL (ref 4.20–5.80)
RDW: 13.7 % (ref 11.0–15.0)
WBC: 7.5 10*3/uL (ref 3.8–10.8)

## 2018-02-16 LAB — LIPID PANEL
CHOL/HDL RATIO: 3.8 (calc) (ref <5.0)
CHOLESTEROL: 96 mg/dL (ref <200)
HDL: 25 mg/dL — AB (ref >40)
LDL CHOLESTEROL (CALC): 39 mg/dL
Non-HDL Cholesterol (Calc): 71 mg/dL (calc) (ref <130)
TRIGLYCERIDES: 272 mg/dL — AB (ref <150)

## 2018-02-16 LAB — COMPLETE METABOLIC PANEL WITH GFR
AG RATIO: 1.7 (calc) (ref 1.0–2.5)
ALBUMIN MSPROF: 4.2 g/dL (ref 3.6–5.1)
ALKALINE PHOSPHATASE (APISO): 66 U/L (ref 40–115)
ALT: 21 U/L (ref 9–46)
AST: 27 U/L (ref 10–35)
BILIRUBIN TOTAL: 0.7 mg/dL (ref 0.2–1.2)
BUN: 13 mg/dL (ref 7–25)
CO2: 25 mmol/L (ref 20–32)
Calcium: 9.4 mg/dL (ref 8.6–10.3)
Chloride: 105 mmol/L (ref 98–110)
Creat: 0.98 mg/dL (ref 0.70–1.25)
GFR, EST AFRICAN AMERICAN: 91 mL/min/{1.73_m2} (ref >=60)
GFR, Est Non African American: 79 mL/min/{1.73_m2} (ref >=60)
GLOBULIN: 2.5 g/dL (ref 1.9–3.7)
Glucose, Bld: 153 mg/dL — ABNORMAL HIGH (ref 65–99)
POTASSIUM: 4.3 mmol/L (ref 3.5–5.3)
SODIUM: 139 mmol/L (ref 135–146)
Total Protein: 6.7 g/dL (ref 6.1–8.1)

## 2018-02-16 LAB — TEST AUTHORIZATION

## 2018-02-16 LAB — HEMOGLOBIN A1C W/OUT EAG: Hgb A1c MFr Bld: 6.9 % of total Hgb — ABNORMAL HIGH (ref <5.7)

## 2018-02-20 ENCOUNTER — Ambulatory Visit (INDEPENDENT_AMBULATORY_CARE_PROVIDER_SITE_OTHER): Payer: Medicare Other | Admitting: Osteopathic Medicine

## 2018-02-20 ENCOUNTER — Encounter: Payer: Self-pay | Admitting: Osteopathic Medicine

## 2018-02-20 VITALS — BP 125/81 | HR 93 | Temp 98.3°F | Wt 207.4 lb

## 2018-02-20 DIAGNOSIS — Z951 Presence of aortocoronary bypass graft: Secondary | ICD-10-CM

## 2018-02-20 DIAGNOSIS — E781 Pure hyperglyceridemia: Secondary | ICD-10-CM

## 2018-02-20 DIAGNOSIS — Z794 Long term (current) use of insulin: Secondary | ICD-10-CM | POA: Diagnosis not present

## 2018-02-20 DIAGNOSIS — E291 Testicular hypofunction: Secondary | ICD-10-CM

## 2018-02-20 DIAGNOSIS — E1159 Type 2 diabetes mellitus with other circulatory complications: Secondary | ICD-10-CM

## 2018-02-20 MED ORDER — INSULIN GLARGINE 300 UNIT/ML ~~LOC~~ SOPN: 80.0000 [IU] | PEN_INJECTOR | Freq: Every day | SUBCUTANEOUS | 11 refills | Status: DC

## 2018-02-20 MED ORDER — AMLODIPINE BESYLATE 5 MG PO TABS: 5.0000 mg | ORAL_TABLET | Freq: Every day | ORAL | 3 refills | Status: DC

## 2018-02-20 MED ORDER — METFORMIN HCL 500 MG PO TABS: 1000.0000 mg | ORAL_TABLET | Freq: Two times a day (BID) | ORAL | 3 refills | Status: DC

## 2018-02-20 NOTE — Patient Instructions (Addendum)
Should consider Vascepa (prescription omega 3) to help Triglycerides.  Will check testosterone.  Have the [facility] fax [place] your results [phone]
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
## 2018-02-21 LAB — FSH/LH
FSH: 6.8 m[IU]/mL (ref 1.6–8.0)
LH: 4 m[IU]/mL (ref 1.6–15.2)

## 2018-02-21 LAB — TESTOSTERONE: TESTOSTERONE: 217 ng/dL — AB (ref 250–827)

## 2018-02-22 ENCOUNTER — Telehealth: Payer: Self-pay | Admitting: Osteopathic Medicine

## 2018-02-22 NOTE — Telephone Encounter (Signed)
Please call patient: I got the lab results from the men's clinic, so if he would like me to go ahead and start him on testosterone I can  1. send a prescription for the gels or patches, or  2. if he decides to do the shots we can have him here for a nurse visit to educate him on how to administer those at home or he can get those here routinely from a nurse every 2 weeks.   Please give him a call and ask what he would like to do?

## 2018-02-26 ENCOUNTER — Other Ambulatory Visit: Payer: Self-pay

## 2018-02-26 DIAGNOSIS — E291 Testicular hypofunction: Secondary | ICD-10-CM

## 2018-02-26 NOTE — Telephone Encounter (Signed)
As per pt, he does not want to do any testosterone injections. Pt is ok with provider sending in a rx for gels or patches. Thanks.

## 2018-02-27 LAB — TESTOSTERONE: Testosterone: 295 ng/dL (ref 250–827)

## 2018-02-28 MED ORDER — TESTOSTERONE 2 MG/24HR TD PT24: 1.0000 | MEDICATED_PATCH | Freq: Every morning | TRANSDERMAL | 1 refills | Status: DC

## 2018-02-28 NOTE — Telephone Encounter (Signed)
Sent Rx for patches

## 2018-02-28 NOTE — Telephone Encounter (Signed)
Pt advised.

## 2018-03-23 ENCOUNTER — Encounter: Payer: Self-pay | Admitting: Osteopathic Medicine

## 2018-03-23 LAB — TESTOSTERONE
Prostate Specific Ag, Serum: 0.4
Testosterone, Serum (Total): 193

## 2018-04-27 ENCOUNTER — Other Ambulatory Visit: Payer: Self-pay | Admitting: Osteopathic Medicine

## 2018-04-27 DIAGNOSIS — E291 Testicular hypofunction: Secondary | ICD-10-CM

## 2018-04-27 DIAGNOSIS — I714 Abdominal aortic aneurysm, without rupture, unspecified: Secondary | ICD-10-CM

## 2018-04-27 NOTE — Telephone Encounter (Signed)
[facility] pharmacy requesting Androderm patches. Pls advise, thanks.

## 2018-04-27 NOTE — Telephone Encounter (Signed)
Refill sent He needs to come in for labs around 05/15/18 - orders are in place

## 2018-04-27 NOTE — Telephone Encounter (Signed)
Left a detailed vm msg for pt regarding med refill & provider's note. Call back info provided.

## 2018-05-15 DIAGNOSIS — R7989 Other specified abnormal findings of blood chemistry: Secondary | ICD-10-CM | POA: Diagnosis not present

## 2018-05-16 LAB — CBC
HCT: 38.4 % — ABNORMAL LOW (ref 38.5–50.0)
Hemoglobin: 13.1 g/dL — ABNORMAL LOW (ref 13.2–17.1)
MCH: 28.9 pg (ref 27.0–33.0)
MCHC: 34.1 g/dL (ref 32.0–36.0)
MCV: 84.8 fL (ref 80.0–100.0)
MPV: 10.6 fL (ref 7.5–12.5)
PLATELETS: 196 10*3/uL (ref 140–400)
RBC: 4.53 10*6/uL (ref 4.20–5.80)
RDW: 13 % (ref 11.0–15.0)
WBC: 7.4 10*3/uL (ref 3.8–10.8)

## 2018-05-16 LAB — PSA, TOTAL WITH REFLEX TO PSA, FREE: PSA, Total: 0.4 ng/mL (ref <=4.0)

## 2018-05-16 LAB — TESTOSTERONE: Testosterone: 458 ng/dL (ref 250–827)

## 2018-05-22 ENCOUNTER — Encounter: Payer: Self-pay | Admitting: Osteopathic Medicine

## 2018-05-22 ENCOUNTER — Ambulatory Visit (INDEPENDENT_AMBULATORY_CARE_PROVIDER_SITE_OTHER): Payer: Medicare Other

## 2018-05-22 ENCOUNTER — Ambulatory Visit (INDEPENDENT_AMBULATORY_CARE_PROVIDER_SITE_OTHER): Payer: Medicare Other | Admitting: Osteopathic Medicine

## 2018-05-22 VITALS — BP 128/75 | HR 72 | Temp 98.0°F | Wt 209.9 lb

## 2018-05-22 DIAGNOSIS — E291 Testicular hypofunction: Secondary | ICD-10-CM | POA: Diagnosis not present

## 2018-05-22 DIAGNOSIS — R05 Cough: Secondary | ICD-10-CM | POA: Diagnosis not present

## 2018-05-22 DIAGNOSIS — Z951 Presence of aortocoronary bypass graft: Secondary | ICD-10-CM

## 2018-05-22 DIAGNOSIS — I700 Atherosclerosis of aorta: Secondary | ICD-10-CM | POA: Diagnosis not present

## 2018-05-22 DIAGNOSIS — Z794 Long term (current) use of insulin: Secondary | ICD-10-CM

## 2018-05-22 DIAGNOSIS — E781 Pure hyperglyceridemia: Secondary | ICD-10-CM | POA: Diagnosis not present

## 2018-05-22 DIAGNOSIS — E782 Mixed hyperlipidemia: Secondary | ICD-10-CM

## 2018-05-22 DIAGNOSIS — E1159 Type 2 diabetes mellitus with other circulatory complications: Secondary | ICD-10-CM | POA: Diagnosis not present

## 2018-05-22 DIAGNOSIS — Z2821 Immunization not carried out because of patient refusal: Secondary | ICD-10-CM

## 2018-05-22 DIAGNOSIS — I10 Essential (primary) hypertension: Secondary | ICD-10-CM

## 2018-05-22 DIAGNOSIS — M79641 Pain in right hand: Secondary | ICD-10-CM

## 2018-05-22 DIAGNOSIS — R053 Chronic cough: Secondary | ICD-10-CM

## 2018-05-22 LAB — POCT GLYCOSYLATED HEMOGLOBIN (HGB A1C): Hemoglobin A1C: 6.4 % — AB (ref 4.0–5.6)

## 2018-05-22 LAB — POCT UA - MICROALBUMIN
Albumin/Creatinine Ratio, Urine, POC: NORMAL
Creatinine, POC: 200 mg/dL
Microalbumin Ur, POC: 30 mg/L

## 2018-05-22 MED ORDER — TESTOSTERONE 2 MG/24HR TD PT24: 1.0000 | MEDICATED_PATCH | Freq: Every day | TRANSDERMAL | 3 refills | Status: DC

## 2018-05-22 MED ORDER — IPRATROPIUM-ALBUTEROL 0.5-2.5 (3) MG/3ML IN SOLN: 3.0000 mL | Freq: Four times a day (QID) | RESPIRATORY_TRACT | Status: DC

## 2018-05-22 MED ORDER — GUAIFENESIN ER 1200 MG PO TB12: 1200.0000 mg | ORAL_TABLET | Freq: Two times a day (BID) | ORAL | 1 refills | Status: DC

## 2018-05-22 MED ORDER — IPRATROPIUM BROMIDE 0.06 % NA SOLN: 2.0000 | Freq: Four times a day (QID) | NASAL | 1 refills | Status: DC

## 2018-05-22 MED ORDER — PREDNISONE 20 MG PO TABS: 20.0000 mg | ORAL_TABLET | Freq: Two times a day (BID) | ORAL | 0 refills | Status: DC
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
## 2018-05-23 ENCOUNTER — Telehealth: Payer: Self-pay | Admitting: Osteopathic Medicine

## 2018-05-23 DIAGNOSIS — I714 Abdominal aortic aneurysm, without rupture, unspecified: Secondary | ICD-10-CM

## 2018-05-23 NOTE — Telephone Encounter (Signed)
Order reentered to be scheduled at [place].

## 2018-05-23 NOTE — Telephone Encounter (Signed)
-----   Message from Emeterio Reeve, DO sent at 05/22/2018  8:09 AM EST ----- Regarding: Abd Ao [place] order issue Order is in for AAA monitoring, he didn't want to go to [facility] for this, can we do these at [place]? If yes, he'd like to be scheduled there. Let me know if new order needs put in, or we can contact Dr Jacalyn Lefevre office and see what they recommend

## 2018-05-28 ENCOUNTER — Ambulatory Visit (HOSPITAL_BASED_OUTPATIENT_CLINIC_OR_DEPARTMENT_OTHER)
Admission: RE | Admit: 2018-05-28 | Discharge: 2018-05-28 | Disposition: A | Discharge status: Home / Self Care | Payer: Medicare Other | Source: Ambulatory Visit | Attending: Osteopathic Medicine | Admitting: Osteopathic Medicine

## 2018-05-28 DIAGNOSIS — I714 Abdominal aortic aneurysm, without rupture, unspecified: Secondary | ICD-10-CM

## 2018-05-28 NOTE — Progress Notes (Addendum)
AAA Duplex   There is evidence of abnormal dilitation of the Distal Abdominal aorta. The largest aortic measurement is 3.0 cm.  The largest aortic diameter is unchanged compared to prior exam. No evidence of obstruction in IVC.   12/16/119 Cardell Peach RDCS, RVT

## 2018-05-29 ENCOUNTER — Ambulatory Visit (INDEPENDENT_AMBULATORY_CARE_PROVIDER_SITE_OTHER): Payer: Medicare Other | Admitting: Sports Medicine

## 2018-05-29 ENCOUNTER — Encounter: Payer: Self-pay | Admitting: Sports Medicine

## 2018-05-29 ENCOUNTER — Ambulatory Visit (INDEPENDENT_AMBULATORY_CARE_PROVIDER_SITE_OTHER): Payer: Medicare Other | Admitting: Osteopathic Medicine

## 2018-05-29 VITALS — BP 128/71 | HR 83 | Temp 98.8°F | Ht 69.0 in | Wt 203.6 lb

## 2018-05-29 DIAGNOSIS — R05 Cough: Secondary | ICD-10-CM | POA: Diagnosis not present

## 2018-05-29 DIAGNOSIS — M1A00X0 Idiopathic chronic gout, unspecified site, without tophus (tophi): Secondary | ICD-10-CM | POA: Diagnosis not present

## 2018-05-29 DIAGNOSIS — R053 Chronic cough: Secondary | ICD-10-CM

## 2018-05-29 MED ORDER — BUDESONIDE-FORMOTEROL FUMARATE 160-4.5 MCG/ACT IN AERO: 2.0000 | INHALATION_SPRAY | Freq: Two times a day (BID) | RESPIRATORY_TRACT | 0 refills | Status: DC

## 2018-05-29 MED ORDER — NAPROXEN 500 MG PO TABS: 500.0000 mg | ORAL_TABLET | Freq: Two times a day (BID) | ORAL | 3 refills | Status: DC

## 2018-05-29 MED ORDER — ALLOPURINOL 300 MG PO TABS: 300.0000 mg | ORAL_TABLET | Freq: Every day | ORAL | 3 refills | Status: DC

## 2018-05-29 MED ORDER — PREDNISONE 50 MG PO TABS: ORAL_TABLET | ORAL | 0 refills | Status: DC

## 2018-05-29 NOTE — Patient Instructions (Signed)
Will try Symbicort inhaler for a week or so If cough still bothering you, will send to pulmonologist
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
## 2018-05-29 NOTE — Assessment & Plan Note (Signed)
Currently in a subclinical flare. He also has widespread diffuse osteoarthritis with Heberden and Bouchard nodes. Adding 5 days of prednisone. Uric acid levels back in 2014 were 8, goal for gout patients is less than 5, we are going to add allopurinol 300 mg daily and recheck in 1 month. He does well with naproxen, switching to prescription strength. Return to see me in a month

## 2018-06-01 ENCOUNTER — Other Ambulatory Visit: Payer: Self-pay

## 2018-06-01 DIAGNOSIS — E1159 Type 2 diabetes mellitus with other circulatory complications: Secondary | ICD-10-CM

## 2018-06-01 MED ORDER — INSULIN PEN NEEDLE 32G X 4 MM MISC: 1 refills | Status: DC

## 2018-06-04 ENCOUNTER — Other Ambulatory Visit (HOSPITAL_COMMUNITY): Payer: Self-pay

## 2018-06-08 NOTE — Addendum Note (Signed)
Addended by: Alena Bills R on: 06/08/2018 10:20 AM   Modules accepted: Orders

## 2018-06-26 ENCOUNTER — Encounter: Payer: Self-pay | Admitting: Sports Medicine

## 2018-06-26 ENCOUNTER — Ambulatory Visit (INDEPENDENT_AMBULATORY_CARE_PROVIDER_SITE_OTHER): Payer: Medicare Other | Admitting: Sports Medicine

## 2018-06-26 DIAGNOSIS — M1A00X0 Idiopathic chronic gout, unspecified site, without tophus (tophi): Secondary | ICD-10-CM

## 2018-06-26 DIAGNOSIS — H353131 Nonexudative age-related macular degeneration, bilateral, early dry stage: Secondary | ICD-10-CM | POA: Diagnosis not present

## 2018-06-26 DIAGNOSIS — M65351 Trigger finger, right little finger: Secondary | ICD-10-CM | POA: Insufficient documentation

## 2018-06-26 DIAGNOSIS — H40013 Open angle with borderline findings, low risk, bilateral: Secondary | ICD-10-CM | POA: Diagnosis not present

## 2018-06-26 DIAGNOSIS — Z961 Presence of intraocular lens: Secondary | ICD-10-CM | POA: Diagnosis not present

## 2018-06-26 DIAGNOSIS — E119 Type 2 diabetes mellitus without complications: Secondary | ICD-10-CM | POA: Diagnosis not present

## 2018-06-26 NOTE — Assessment & Plan Note (Signed)
Not painful, not severe enough to have a flexor tendon sheath injection. Return as needed for this.

## 2018-06-26 NOTE — Assessment & Plan Note (Signed)
Subclinical gout flare with hyperuricemia as well as osteoarthritis with Heberden and Bouchard nodes now 90% better with allopurinol and prescription strength naproxen. Rechecking uric acid levels, no further intervention needed, he feels happy with his result so far.
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
## 2018-06-27 LAB — COMPREHENSIVE METABOLIC PANEL
AG Ratio: 2 (calc) (ref 1.0–2.5)
ALT: 24 U/L (ref 9–46)
AST: 24 U/L (ref 10–35)
BUN: 14 mg/dL (ref 7–25)
CO2: 21 mmol/L (ref 20–32)
Calcium: 9.3 mg/dL (ref 8.6–10.3)
Chloride: 104 mmol/L (ref 98–110)
Globulin: 2.2 g/dL (calc) (ref 1.9–3.7)
Glucose, Bld: 218 mg/dL — ABNORMAL HIGH (ref 65–99)
Potassium: 4.7 mmol/L (ref 3.5–5.3)
Sodium: 138 mmol/L (ref 135–146)
Total Bilirubin: 0.5 mg/dL (ref 0.2–1.2)
Total Protein: 6.5 g/dL (ref 6.1–8.1)

## 2018-06-27 LAB — COMPREHENSIVE METABOLIC PANEL WITH GFR
Albumin: 4.3 g/dL (ref 3.6–5.1)
Alkaline phosphatase (APISO): 81 U/L (ref 40–115)
Creat: 1.06 mg/dL (ref 0.70–1.25)

## 2018-06-27 LAB — URIC ACID: Uric Acid, Serum: 5 mg/dL (ref 4.0–8.0)

## 2018-07-18 ENCOUNTER — Other Ambulatory Visit: Payer: Self-pay

## 2018-07-18 MED ORDER — IPRATROPIUM BROMIDE 0.06 % NA SOLN: 2.0000 | Freq: Four times a day (QID) | NASAL | 1 refills | Status: DC

## 2018-07-26 ENCOUNTER — Other Ambulatory Visit: Payer: Self-pay | Admitting: Osteopathic Medicine

## 2018-08-16 ENCOUNTER — Other Ambulatory Visit: Payer: Self-pay

## 2018-08-16 MED ORDER — IPRATROPIUM BROMIDE 0.06 % NA SOLN: 2.0000 | Freq: Four times a day (QID) | NASAL | 1 refills | Status: DC

## 2018-08-21 ENCOUNTER — Ambulatory Visit (INDEPENDENT_AMBULATORY_CARE_PROVIDER_SITE_OTHER): Payer: Medicare Other | Admitting: Osteopathic Medicine

## 2018-08-21 ENCOUNTER — Encounter: Payer: Self-pay | Admitting: Osteopathic Medicine

## 2018-08-21 VITALS — BP 117/81 | HR 88 | Temp 97.7°F | Wt 219.9 lb

## 2018-08-21 DIAGNOSIS — Z794 Long term (current) use of insulin: Secondary | ICD-10-CM

## 2018-08-21 DIAGNOSIS — E1159 Type 2 diabetes mellitus with other circulatory complications: Secondary | ICD-10-CM

## 2018-08-21 DIAGNOSIS — R0981 Nasal congestion: Secondary | ICD-10-CM | POA: Diagnosis not present

## 2018-08-21 LAB — POCT GLYCOSYLATED HEMOGLOBIN (HGB A1C): Hemoglobin A1C: 7.9 % — AB (ref 4.0–5.6)

## 2018-08-21 MED ORDER — FLUTICASONE PROPIONATE 50 MCG/ACT NA SUSP: NASAL | 3 refills | Status: DC

## 2018-08-21 MED ORDER — LORATADINE 10 MG PO TABS: 10.0000 mg | ORAL_TABLET | Freq: Every day | ORAL | 3 refills | Status: DC

## 2018-08-21 MED ORDER — AMOXICILLIN-POT CLAVULANATE 875-125 MG PO TABS: 1.0000 | ORAL_TABLET | Freq: Two times a day (BID) | ORAL | 0 refills | Status: AC
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
## 2018-08-21 NOTE — Patient Instructions (Addendum)
Allergies can typically be treated successfully with over-the-counter medications. I usually will recommend a combination of: . steroid nasal spray such as Flonase or Nasonex or one of their generic equivalents, PLUS... . an antihistamine such as Allegra, Zyrtec, or Claritin or one of their generic equivalents.  . Can also add a decongestant, either Sudafed on its own OR any of the antihistamines with the "-D" in the name that he has to show your ID to the pharmacist to buy.  . I recommend avoiding Afrin nasal spray unless absolutely needed, and then using it only for 2 days to prevent rebound congestion when the medicine is stopped.   If the combination isn't helping after about 2 weeks, try the antibiotics and if THAT isn't helping, let me know    Toujeo adjustment  Measure fasting blood sugar every day: goal for now is to get this to 90-120  Increase daily Insulin dose by 3 units at a time, twice per week, until fasting sugars are consistently 90-120, then continue at that dose  Can decrease back to 80 units daily once you're more active

## 2018-08-24 ENCOUNTER — Telehealth: Payer: Self-pay

## 2018-08-24 NOTE — Telephone Encounter (Signed)
As per pt, he will need a new rx for insulin. Pt has increased from 80 units to 100 - 120 units a day. Pt states he's going to be short on his rx. [facility] Thanks.

## 2018-08-25 MED ORDER — INSULIN GLARGINE (2 UNIT DIAL) 300 UNIT/ML ~~LOC~~ SOPN: 80.0000 [IU] | PEN_INJECTOR | Freq: Every day | SUBCUTANEOUS | 99 refills | Status: DC

## 2018-08-25 NOTE — Telephone Encounter (Signed)
Rx sent 

## 2018-08-27 NOTE — Telephone Encounter (Signed)
Pt has been updated. No other inquiries during call.  

## 2018-09-09 ENCOUNTER — Other Ambulatory Visit: Payer: Self-pay | Admitting: Osteopathic Medicine

## 2018-09-14 ENCOUNTER — Other Ambulatory Visit: Payer: Self-pay | Admitting: Sports Medicine

## 2018-09-14 ENCOUNTER — Telehealth: Payer: Self-pay | Admitting: Cardiology

## 2018-09-14 DIAGNOSIS — M1A00X0 Idiopathic chronic gout, unspecified site, without tophus (tophi): Secondary | ICD-10-CM

## 2018-09-14 NOTE — Telephone Encounter (Signed)
° °  Patient states he is returning call regarding 4/15 appt in [place].  Patient states" call him when we get our schedules together" he only wants [place] appt Patient declined to schedule any type of appt

## 2018-09-18 ENCOUNTER — Other Ambulatory Visit: Payer: Self-pay | Admitting: Sports Medicine

## 2018-09-18 DIAGNOSIS — M1A00X0 Idiopathic chronic gout, unspecified site, without tophus (tophi): Secondary | ICD-10-CM

## 2018-09-26 ENCOUNTER — Ambulatory Visit: Payer: Medicare Other | Admitting: Cardiology

## 2018-10-09 ENCOUNTER — Other Ambulatory Visit: Payer: Self-pay | Admitting: Osteopathic Medicine

## 2018-10-10 ENCOUNTER — Other Ambulatory Visit: Payer: Self-pay | Admitting: Osteopathic Medicine

## 2018-10-10 DIAGNOSIS — E1159 Type 2 diabetes mellitus with other circulatory complications: Secondary | ICD-10-CM

## 2018-10-10 NOTE — Telephone Encounter (Signed)
Medication routed to Dr. Redgie Grayer rx refill pool

## 2018-10-22 ENCOUNTER — Other Ambulatory Visit: Payer: Self-pay | Admitting: Osteopathic Medicine

## 2018-11-15 ENCOUNTER — Other Ambulatory Visit: Payer: Self-pay | Admitting: Osteopathic Medicine

## 2018-11-21 ENCOUNTER — Encounter: Payer: Self-pay | Admitting: Osteopathic Medicine

## 2018-11-21 ENCOUNTER — Ambulatory Visit (INDEPENDENT_AMBULATORY_CARE_PROVIDER_SITE_OTHER): Payer: Medicare Other | Admitting: Osteopathic Medicine

## 2018-11-21 VITALS — BP 120/65 | HR 86 | Temp 98.0°F | Wt 208.8 lb

## 2018-11-21 DIAGNOSIS — I10 Essential (primary) hypertension: Secondary | ICD-10-CM | POA: Diagnosis not present

## 2018-11-21 DIAGNOSIS — Z0000 Encounter for general adult medical examination without abnormal findings: Secondary | ICD-10-CM

## 2018-11-21 DIAGNOSIS — M1A00X0 Idiopathic chronic gout, unspecified site, without tophus (tophi): Secondary | ICD-10-CM | POA: Diagnosis not present

## 2018-11-21 DIAGNOSIS — E349 Endocrine disorder, unspecified: Secondary | ICD-10-CM

## 2018-11-21 DIAGNOSIS — Z794 Long term (current) use of insulin: Secondary | ICD-10-CM | POA: Diagnosis not present

## 2018-11-21 DIAGNOSIS — E1159 Type 2 diabetes mellitus with other circulatory complications: Secondary | ICD-10-CM | POA: Diagnosis not present

## 2018-11-21 LAB — POCT GLYCOSYLATED HEMOGLOBIN (HGB A1C): Hemoglobin A1C: 6.6 % — AB (ref 4.0–5.6)
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
## 2018-12-18 ENCOUNTER — Other Ambulatory Visit: Payer: Self-pay | Admitting: Osteopathic Medicine

## 2018-12-18 NOTE — Telephone Encounter (Signed)
Please advise 

## 2019-01-05 ENCOUNTER — Other Ambulatory Visit: Payer: Self-pay | Admitting: Sports Medicine

## 2019-01-05 DIAGNOSIS — M1A00X0 Idiopathic chronic gout, unspecified site, without tophus (tophi): Secondary | ICD-10-CM

## 2019-01-10 ENCOUNTER — Other Ambulatory Visit: Payer: Self-pay

## 2019-01-10 DIAGNOSIS — E1159 Type 2 diabetes mellitus with other circulatory complications: Secondary | ICD-10-CM

## 2019-01-10 DIAGNOSIS — Z794 Long term (current) use of insulin: Secondary | ICD-10-CM

## 2019-01-10 MED ORDER — TOUJEO MAX SOLOSTAR 300 UNIT/ML ~~LOC~~ SOPN: 110.0000 [IU] | PEN_INJECTOR | Freq: Every day | SUBCUTANEOUS | 99 refills | Status: DC

## 2019-01-10 NOTE — Telephone Encounter (Signed)
Patient called needing a RF on Toujeo. States he is injection 108 units daily but he has to waste 4 units every time he changes the needle, wanting RX to be called in for #112 units a day..  Original RX read "inject 80-100 units daily"  RX pended for "inject 100-115 units daily"   Please send RX if this is ok

## 2019-01-15 ENCOUNTER — Telehealth: Payer: Self-pay

## 2019-01-15 DIAGNOSIS — E1159 Type 2 diabetes mellitus with other circulatory complications: Secondary | ICD-10-CM

## 2019-01-15 DIAGNOSIS — Z794 Long term (current) use of insulin: Secondary | ICD-10-CM

## 2019-01-15 MED ORDER — TOUJEO MAX SOLOSTAR 300 UNIT/ML ~~LOC~~ SOPN: 110.0000 [IU] | PEN_INJECTOR | Freq: Every day | SUBCUTANEOUS | 99 refills | Status: DC

## 2019-01-15 NOTE — Telephone Encounter (Signed)
[facility]. As per pharmacy, pt currently taking 120u daily. Requesting clarification on daily insulin dose. [facility] Thanks.

## 2019-01-15 NOTE — Telephone Encounter (Signed)
Thought I fixed this 01/10/19 but will re-send.Marland KitchenMarland Kitchen

## 2019-01-22 ENCOUNTER — Other Ambulatory Visit: Payer: Self-pay | Admitting: Sports Medicine

## 2019-01-22 ENCOUNTER — Other Ambulatory Visit: Payer: Self-pay | Admitting: Osteopathic Medicine

## 2019-01-22 DIAGNOSIS — M1A00X0 Idiopathic chronic gout, unspecified site, without tophus (tophi): Secondary | ICD-10-CM

## 2019-01-22 NOTE — Telephone Encounter (Signed)
Please advise 

## 2019-02-13 DIAGNOSIS — Z794 Long term (current) use of insulin: Secondary | ICD-10-CM | POA: Diagnosis not present

## 2019-02-13 DIAGNOSIS — M1A00X0 Idiopathic chronic gout, unspecified site, without tophus (tophi): Secondary | ICD-10-CM | POA: Diagnosis not present

## 2019-02-13 DIAGNOSIS — E1159 Type 2 diabetes mellitus with other circulatory complications: Secondary | ICD-10-CM | POA: Diagnosis not present

## 2019-02-13 DIAGNOSIS — E349 Endocrine disorder, unspecified: Secondary | ICD-10-CM | POA: Diagnosis not present

## 2019-02-13 DIAGNOSIS — I10 Essential (primary) hypertension: Secondary | ICD-10-CM | POA: Diagnosis not present

## 2019-02-14 LAB — COMPLETE METABOLIC PANEL WITH GFR
AG Ratio: 2 (calc) (ref 1.0–2.5)
ALT: 34 U/L (ref 9–46)
AST: 43 U/L — ABNORMAL HIGH (ref 10–35)
Albumin: 4.4 g/dL (ref 3.6–5.1)
Alkaline phosphatase (APISO): 67 U/L (ref 35–144)
BUN: 15 mg/dL (ref 7–25)
CO2: 23 mmol/L (ref 20–32)
Calcium: 9.5 mg/dL (ref 8.6–10.3)
Chloride: 108 mmol/L (ref 98–110)
Creat: 0.86 mg/dL (ref 0.70–1.25)
GFR, Est African American: 103 mL/min/{1.73_m2} (ref >=60)
GFR, Est Non African American: 88 mL/min/{1.73_m2} (ref >=60)
Globulin: 2.2 g/dL (calc) (ref 1.9–3.7)
Glucose, Bld: 131 mg/dL — ABNORMAL HIGH (ref 65–99)
Potassium: 4.5 mmol/L (ref 3.5–5.3)
Sodium: 141 mmol/L (ref 135–146)
Total Bilirubin: 0.6 mg/dL (ref 0.2–1.2)
Total Protein: 6.6 g/dL (ref 6.1–8.1)

## 2019-02-14 LAB — LIPID PANEL
Cholesterol: 109 mg/dL (ref <200)
HDL: 28 mg/dL — ABNORMAL LOW (ref >=40)
LDL Cholesterol (Calc): 48 mg/dL (calc)
Non-HDL Cholesterol (Calc): 81 mg/dL (calc) (ref <130)
Total CHOL/HDL Ratio: 3.9 (calc) (ref <5.0)
Triglycerides: 282 mg/dL — ABNORMAL HIGH (ref <150)

## 2019-02-14 LAB — CBC
HCT: 39.2 % (ref 38.5–50.0)
Hemoglobin: 13.1 g/dL — ABNORMAL LOW (ref 13.2–17.1)
MCH: 29.2 pg (ref 27.0–33.0)
MCHC: 33.4 g/dL (ref 32.0–36.0)
MCV: 87.5 fL (ref 80.0–100.0)
MPV: 10.6 fL (ref 7.5–12.5)
Platelets: 195 10*3/uL (ref 140–400)
RBC: 4.48 10*6/uL (ref 4.20–5.80)
RDW: 14.3 % (ref 11.0–15.0)
WBC: 6.7 10*3/uL (ref 3.8–10.8)

## 2019-02-14 LAB — URIC ACID: Uric Acid, Serum: 4.7 mg/dL (ref 4.0–8.0)

## 2019-02-14 LAB — HEMOGLOBIN A1C
Hgb A1c MFr Bld: 6.7 % of total Hgb — ABNORMAL HIGH (ref <5.7)
Mean Plasma Glucose: 146 (calc)
eAG (mmol/L): 8.1 (calc)

## 2019-02-14 LAB — TESTOSTERONE: Testosterone: 312 ng/dL (ref 250–827)

## 2019-02-21 ENCOUNTER — Encounter: Payer: Self-pay | Admitting: Osteopathic Medicine

## 2019-02-21 ENCOUNTER — Other Ambulatory Visit: Payer: Self-pay

## 2019-02-21 ENCOUNTER — Ambulatory Visit (INDEPENDENT_AMBULATORY_CARE_PROVIDER_SITE_OTHER): Payer: Medicare Other | Admitting: Osteopathic Medicine

## 2019-02-21 VITALS — BP 125/77 | HR 88 | Temp 98.1°F | Wt 218.6 lb

## 2019-02-21 DIAGNOSIS — I714 Abdominal aortic aneurysm, without rupture, unspecified: Secondary | ICD-10-CM

## 2019-02-21 DIAGNOSIS — Z23 Encounter for immunization: Secondary | ICD-10-CM

## 2019-02-21 DIAGNOSIS — Z0000 Encounter for general adult medical examination without abnormal findings: Secondary | ICD-10-CM

## 2019-02-21 DIAGNOSIS — I10 Essential (primary) hypertension: Secondary | ICD-10-CM

## 2019-02-21 DIAGNOSIS — Z794 Long term (current) use of insulin: Secondary | ICD-10-CM

## 2019-02-21 DIAGNOSIS — M1A00X0 Idiopathic chronic gout, unspecified site, without tophus (tophi): Secondary | ICD-10-CM | POA: Diagnosis not present

## 2019-02-21 DIAGNOSIS — E1159 Type 2 diabetes mellitus with other circulatory complications: Secondary | ICD-10-CM

## 2019-02-21 MED ORDER — NAPROXEN 500 MG PO TABS: 500.0000 mg | ORAL_TABLET | Freq: Two times a day (BID) | ORAL | 3 refills | Status: DC

## 2019-02-21 NOTE — Patient Instructions (Signed)
General Preventive Care  Most recent routine screening lipids/other labs: already done.   Tobacco: don't!   Alcohol: responsible moderation is ok for most adults - if you have concerns about your alcohol intake, please talk to me!   Exercise: as tolerated to reduce risk of cardiovascular disease and diabetes. Strength training will also prevent osteoporosis.   Mental health: if need for mental health care (medicines, counseling, other), or concerns about moods, please let me know!   Sexual health: if need for STD testing, or if concerns with libido/pain problems, please let me know!   Advanced Directive: Living Will and/or Healthcare Power of Attorney recommended for all adults, regardless of age or health.  Vaccines  Flu vaccine: recommended for almost everyone, every fall.   Shingles vaccine: Shingrix recommended after age 9.   Pneumonia vaccines: Prevnar and Pneumovax recommended after age 63, or sooner if certain medical conditions.  Tetanus booster: Tdap recommended every 10 years.  Cancer screenings   Colon cancer screening: recommended for everyone at age 44, but some folks need a colonoscopy sooner if risk factors   Prostate cancer screening: PSA blood test around age 60  Lung cancer screening: CT chest every year for those age 33 to 69 years old with ?30 pack year smoking history, who either currently smoke or have quit within the past 15 years. Infection screenings . HIV: recommended screening at least once age 42-65, more often as needed. . Gonorrhea/Chlamydia: screening as needed.  . Hepatitis C: recommended once for anyone born 28-1966 . TB: certain at-risk populations, or depending on work requirements and/or travel history Other . Bone Density Test: recommended for men at age 79, sooner depending on risk factors . Abdominal Aortic Aneurysm: screening with ultrasound recommended once for men age 60-75 who have ever smoked. Due for follow-up ultrasounds  (initially ordered by Dr. Stanford Breed 11/2015)
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
## 2019-03-11 ENCOUNTER — Other Ambulatory Visit: Payer: Medicare Other

## 2019-04-06 ENCOUNTER — Other Ambulatory Visit: Payer: Self-pay | Admitting: Osteopathic Medicine

## 2019-04-06 DIAGNOSIS — E1159 Type 2 diabetes mellitus with other circulatory complications: Secondary | ICD-10-CM

## 2019-04-06 NOTE — Telephone Encounter (Signed)
Requested Prescriptions  Pending Prescriptions Disp Refills   metFORMIN (GLUCOPHAGE) 500 MG tablet [Pharmacy Med Name: METFORMIN HCL 500 MG TABLET] 360 tablet 3    Sig: Take 2 tablets (1,000 mg total) by mouth 2 (two) times daily with a meal.     Endocrinology:  Diabetes - Biguanides Failed - 04/06/2019  9:00 AM      Failed - Valid encounter within last 6 months    Recent Outpatient Visits          1 month ago Annual physical exam   [place] [facility], Lanelle Bal, DO   4 months ago Type 2 diabetes mellitus with other circulatory complication, with long-term current use of insulin [facility])   [place] [facility], Lanelle Bal, DO   7 months ago Type 2 diabetes mellitus with other circulatory complication, with long-term current use of insulin [facility])   [place] [facility], Lanelle Bal, DO   9 months ago Idiopathic chronic 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 .          Passed - HBA1C is between 0 and 7.9 and within 180 days    Hgb A1c MFr Bld  Date Value Ref Range Status  02/13/2019 6.7 (H) <5.7 % of total Hgb Final    Comment:    For someone without known diabetes, a hemoglobin A1c value of 6.5% or greater indicates that they may have  diabetes and this should be confirmed with a follow-up  test. . For someone with known diabetes, a value <7% indicates  that  their diabetes is well controlled and a value  greater than or equal to 7% indicates suboptimal  control. A1c targets should be individualized based on  duration of diabetes, age, comorbid conditions, and  other considerations. . Currently, no consensus exists regarding use of hemoglobin A1c for diagnosis of diabetes for children. .          Passed - eGFR in normal range and within 360 days    GFR, Est African American  Date Value Ref Range Status  02/13/2019 103 > OR = 60 mL/min/1.38m Final   GFR, Est Non African American  Date Value Ref Range Status  02/13/2019 88 > OR = 60 mL/min/1.730mFinal          BD PEN NEEDLE NANO U/F 32G X 4 MM MISC [Pharmacy Med Name: BD UF NANO PEN NEEDLE [id number]]  1    Sig: USE TO INJECT INSULIN DAILY. DM E11.59     Endocrinology: Diabetes - Testing Supplies Failed - 04/06/2019  9:00 AM      Failed - Valid encounter within last 12 months    Recent Outpatient Visits          1 month ago Annual physical exam   [facility]  [place]   4 months ago Type 2 diabetes mellitus with other circulatory complication, with long-term current use of insulin [facility])   [place] [facility], [place], DO   7 months ago Type 2 diabetes mellitus with other circulatory complication, with long-term current use of insulin [facility])   [place] [facility], Lanelle Bal, DO   9 months ago 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

## 2019-04-10 ENCOUNTER — Other Ambulatory Visit: Payer: Self-pay | Admitting: Osteopathic Medicine

## 2019-04-15 ENCOUNTER — Other Ambulatory Visit: Payer: Self-pay | Admitting: Sports Medicine

## 2019-04-15 DIAGNOSIS — M1A00X0 Idiopathic chronic gout, unspecified site, without tophus (tophi): Secondary | ICD-10-CM

## 2019-04-15 MED ORDER — ALLOPURINOL 300 MG PO TABS: 300.0000 mg | ORAL_TABLET | Freq: Every day | ORAL | 1 refills | Status: DC

## 2019-06-24 ENCOUNTER — Encounter: Payer: Self-pay | Admitting: Osteopathic Medicine

## 2019-06-24 ENCOUNTER — Other Ambulatory Visit: Payer: Self-pay

## 2019-06-24 ENCOUNTER — Ambulatory Visit (INDEPENDENT_AMBULATORY_CARE_PROVIDER_SITE_OTHER): Payer: Medicare Other | Admitting: Osteopathic Medicine

## 2019-06-24 VITALS — BP 131/79 | HR 80 | Temp 97.7°F | Wt 205.1 lb

## 2019-06-24 DIAGNOSIS — E1159 Type 2 diabetes mellitus with other circulatory complications: Secondary | ICD-10-CM

## 2019-06-24 DIAGNOSIS — E291 Testicular hypofunction: Secondary | ICD-10-CM | POA: Diagnosis not present

## 2019-06-24 DIAGNOSIS — M1A00X0 Idiopathic chronic gout, unspecified site, without tophus (tophi): Secondary | ICD-10-CM | POA: Diagnosis not present

## 2019-06-24 DIAGNOSIS — Z794 Long term (current) use of insulin: Secondary | ICD-10-CM

## 2019-06-24 DIAGNOSIS — I10 Essential (primary) hypertension: Secondary | ICD-10-CM | POA: Diagnosis not present

## 2019-06-24 LAB — POCT GLYCOSYLATED HEMOGLOBIN (HGB A1C): Hemoglobin A1C: 6.2 % — AB (ref 4.0–5.6)

## 2019-06-24 MED ORDER — METFORMIN HCL 500 MG PO TABS: 1000.0000 mg | ORAL_TABLET | Freq: Two times a day (BID) | ORAL | 3 refills | Status: DC

## 2019-06-24 MED ORDER — ATORVASTATIN CALCIUM 40 MG PO TABS: 40.0000 mg | ORAL_TABLET | Freq: Every day | ORAL | 3 refills | Status: DC

## 2019-06-24 MED ORDER — TOUJEO MAX SOLOSTAR 300 UNIT/ML ~~LOC~~ SOPN: 110.0000 [IU] | PEN_INJECTOR | Freq: Every day | SUBCUTANEOUS | 99 refills | Status: DC

## 2019-06-24 MED ORDER — ALLOPURINOL 300 MG PO TABS: 300.0000 mg | ORAL_TABLET | Freq: Every day | ORAL | 3 refills | Status: DC

## 2019-06-24 MED ORDER — AMLODIPINE BESYLATE 5 MG PO TABS: 5.0000 mg | ORAL_TABLET | Freq: Every day | ORAL | 3 refills | Status: DC

## 2019-06-24 MED ORDER — GLUCOSE 5 G PO CHEW: 15.0000 g | CHEWABLE_TABLET | ORAL | 12 refills | Status: DC | PRN

## 2019-06-24 MED ORDER — NAPROXEN 500 MG PO TABS: 500.0000 mg | ORAL_TABLET | Freq: Two times a day (BID) | ORAL | 3 refills | Status: DC
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
## 2019-06-26 DIAGNOSIS — H26492 Other secondary cataract, left eye: Secondary | ICD-10-CM | POA: Diagnosis not present

## 2019-06-26 DIAGNOSIS — H353131 Nonexudative age-related macular degeneration, bilateral, early dry stage: Secondary | ICD-10-CM | POA: Diagnosis not present

## 2019-06-26 DIAGNOSIS — E119 Type 2 diabetes mellitus without complications: Secondary | ICD-10-CM | POA: Diagnosis not present

## 2019-06-26 DIAGNOSIS — Z961 Presence of intraocular lens: Secondary | ICD-10-CM | POA: Diagnosis not present

## 2019-06-26 DIAGNOSIS — H40013 Open angle with borderline findings, low risk, bilateral: Secondary | ICD-10-CM | POA: Diagnosis not present

## 2019-11-22 ENCOUNTER — Other Ambulatory Visit: Payer: Self-pay

## 2019-11-22 ENCOUNTER — Ambulatory Visit (INDEPENDENT_AMBULATORY_CARE_PROVIDER_SITE_OTHER): Payer: Medicare Other

## 2019-11-22 ENCOUNTER — Encounter: Payer: Self-pay | Admitting: Sports Medicine

## 2019-11-22 ENCOUNTER — Ambulatory Visit (INDEPENDENT_AMBULATORY_CARE_PROVIDER_SITE_OTHER): Payer: Medicare Other | Admitting: Sports Medicine

## 2019-11-22 DIAGNOSIS — S4991XA Unspecified injury of right shoulder and upper arm, initial encounter: Secondary | ICD-10-CM | POA: Diagnosis not present

## 2019-11-22 DIAGNOSIS — M7541 Impingement syndrome of right shoulder: Secondary | ICD-10-CM

## 2019-11-22 DIAGNOSIS — S46211A Strain of muscle, fascia and tendon of other parts of biceps, right arm, initial encounter: Secondary | ICD-10-CM | POA: Diagnosis not present

## 2019-11-22 NOTE — Assessment & Plan Note (Signed)
A couple of hours ago [name] was also trying to lift a piece of furniture, he felt a sharp pain and heard a loud pop in his right shoulder, he developed immediate weakness, minimal pain, and noted deformity in his biceps muscle belly. On exam he has an obvious Popeye deformity without much tenderness, he has good strength to supination and full strength of flexion at the elbow all consistent with a proximal rupture of the long head of the biceps. I explained to him the benign nature of this injury, and that no specific treatment was needed.

## 2019-11-22 NOTE — Progress Notes (Signed)
    Procedures performed today:    Procedure: Real-time Ultrasound Guided injection of the right subacromial bursa Device: Samsung [id number]  Verbal informed consent obtained.  Time-out conducted.  Noted no overlying erythema, induration, or other signs of local infection.  Skin prepped in a sterile fashion.  Local anesthesia: Topical Ethyl chloride.  With sterile technique and under real time ultrasound guidance: 1 cc Kenalog 40, 1 cc lidocaine, 1cc bupivacaine injected easily Completed without difficulty  Pain immediately resolved suggesting accurate placement of the medication.  Advised to call if fevers/chills, erythema, induration, drainage, or persistent bleeding.  Images permanently stored and available for review in the ultrasound unit.  Impression: Technically successful ultrasound guided injection.  Independent interpretation of notes and tests performed by another provider:   None.  Brief History, Exam, Impression, and Recommendations:    Impingement syndrome, shoulder, right [name] is a pleasant 69 year old male, he works digging ditches, holes.  He has been hurting since September, localized over the deltoid, waking him from sleep, worse with abduction. Today we injected his subacromial bursa, he did have a positive Neer's, Hawkins, empty can signs. Adding x-rays, home rehab exercises, return in 6 weeks.  Rupture of right proximal biceps tendon A couple of hours ago [name] was also trying to lift a piece of furniture, he felt a sharp pain and heard a loud pop in his right shoulder, he developed immediate weakness, minimal pain, and noted deformity in his biceps muscle belly. On exam he has an obvious Popeye deformity without much tenderness, he has good strength to supination and full strength of flexion at the elbow all consistent with a proximal rupture of the long head of the biceps. I explained to him the benign nature of this injury, and that no specific treatment was  needed.    ___________________________________________ Gwen Her. Dianah Field, M.D., ABFM., CAQSM. Primary Care and [place] Instructor of [place] of [facility] [employer]

## 2019-11-22 NOTE — Assessment & Plan Note (Signed)
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  He has been hurting since September, localized over the deltoid, waking him from sleep, worse with abduction. Today we injected his subacromial bursa, he did have a positive Neer's, Hawkins, empty can signs. Adding x-rays, home rehab exercises, return in 6 weeks.

## 2019-12-23 ENCOUNTER — Ambulatory Visit: Payer: Medicare Other | Admitting: Osteopathic Medicine

## 2019-12-23 DIAGNOSIS — E119 Type 2 diabetes mellitus without complications: Secondary | ICD-10-CM | POA: Diagnosis not present

## 2019-12-23 DIAGNOSIS — I10 Essential (primary) hypertension: Secondary | ICD-10-CM | POA: Diagnosis not present

## 2019-12-23 DIAGNOSIS — E1159 Type 2 diabetes mellitus with other circulatory complications: Secondary | ICD-10-CM | POA: Diagnosis not present

## 2019-12-23 DIAGNOSIS — M1A00X0 Idiopathic chronic gout, unspecified site, without tophus (tophi): Secondary | ICD-10-CM | POA: Diagnosis not present

## 2019-12-23 DIAGNOSIS — Z794 Long term (current) use of insulin: Secondary | ICD-10-CM | POA: Diagnosis not present

## 2019-12-24 LAB — CBC
HCT: 42.8 % (ref 38.5–50.0)
Hemoglobin: 14.7 g/dL (ref 13.2–17.1)
MCH: 30.2 pg (ref 27.0–33.0)
MCHC: 34.3 g/dL (ref 32.0–36.0)
MCV: 87.9 fL (ref 80.0–100.0)
MPV: 10.6 fL (ref 7.5–12.5)
Platelets: 196 10*3/uL (ref 140–400)
RBC: 4.87 10*6/uL (ref 4.20–5.80)
RDW: 13.8 % (ref 11.0–15.0)
WBC: 7.5 10*3/uL (ref 3.8–10.8)

## 2019-12-24 LAB — HEMOGLOBIN A1C
Hgb A1c MFr Bld: 6.2 % of total Hgb — ABNORMAL HIGH (ref <5.7)
Mean Plasma Glucose: 131 (calc)
eAG (mmol/L): 7.3 (calc)

## 2019-12-24 LAB — COMPLETE METABOLIC PANEL WITH GFR
AG Ratio: 1.8 (calc) (ref 1.0–2.5)
ALT: 16 U/L (ref 9–46)
AST: 18 U/L (ref 10–35)
Albumin: 4.4 g/dL (ref 3.6–5.1)
Alkaline phosphatase (APISO): 79 U/L (ref 35–144)
BUN: 13 mg/dL (ref 7–25)
CO2: 26 mmol/L (ref 20–32)
Calcium: 9.8 mg/dL (ref 8.6–10.3)
Chloride: 110 mmol/L (ref 98–110)
Creat: 1.02 mg/dL (ref 0.70–1.18)
GFR, Est African American: 86 mL/min/{1.73_m2} (ref >=60)
GFR, Est Non African American: 74 mL/min/{1.73_m2} (ref >=60)
Globulin: 2.4 g/dL (calc) (ref 1.9–3.7)
Glucose, Bld: 87 mg/dL (ref 65–139)
Potassium: 4.8 mmol/L (ref 3.5–5.3)
Sodium: 141 mmol/L (ref 135–146)
Total Bilirubin: 0.6 mg/dL (ref 0.2–1.2)
Total Protein: 6.8 g/dL (ref 6.1–8.1)

## 2019-12-24 LAB — PSA, TOTAL WITH REFLEX TO PSA, FREE: PSA, Total: 0.3 ng/mL (ref <=4.0)

## 2019-12-24 LAB — LIPID PANEL
Cholesterol: 99 mg/dL (ref <200)
HDL: 35 mg/dL — ABNORMAL LOW (ref >=40)
LDL Cholesterol (Calc): 44 mg/dL (calc)
Non-HDL Cholesterol (Calc): 64 mg/dL (calc) (ref <130)
Total CHOL/HDL Ratio: 2.8 (calc) (ref <5.0)
Triglycerides: 122 mg/dL (ref <150)

## 2019-12-24 LAB — TESTOSTERONE: Testosterone: 450 ng/dL (ref 250–827)

## 2019-12-24 LAB — URIC ACID: Uric Acid, Serum: 4.3 mg/dL (ref 4.0–8.0)

## 2019-12-24 NOTE — Progress Notes (Signed)
[name],   TG much better than 10 months ago.  Testosterone great.  Uric acid to goal.  A1C stable at 6.2.   Labs look great.   Do you need any refills?   Iran Planas PA-C

## 2019-12-28 ENCOUNTER — Other Ambulatory Visit: Payer: Self-pay | Admitting: Osteopathic Medicine

## 2019-12-28 DIAGNOSIS — E1159 Type 2 diabetes mellitus with other circulatory complications: Secondary | ICD-10-CM

## 2019-12-31 ENCOUNTER — Ambulatory Visit (INDEPENDENT_AMBULATORY_CARE_PROVIDER_SITE_OTHER): Payer: Medicare Other | Admitting: Osteopathic Medicine

## 2019-12-31 ENCOUNTER — Encounter: Payer: Self-pay | Admitting: Osteopathic Medicine

## 2019-12-31 ENCOUNTER — Other Ambulatory Visit: Payer: Self-pay

## 2019-12-31 VITALS — BP 125/74 | HR 90 | Temp 98.0°F | Wt 202.0 lb

## 2019-12-31 DIAGNOSIS — E1159 Type 2 diabetes mellitus with other circulatory complications: Secondary | ICD-10-CM

## 2019-12-31 DIAGNOSIS — I2510 Atherosclerotic heart disease of native coronary artery without angina pectoris: Secondary | ICD-10-CM | POA: Diagnosis not present

## 2019-12-31 DIAGNOSIS — Z794 Long term (current) use of insulin: Secondary | ICD-10-CM

## 2019-12-31 DIAGNOSIS — I10 Essential (primary) hypertension: Secondary | ICD-10-CM

## 2019-12-31 DIAGNOSIS — E291 Testicular hypofunction: Secondary | ICD-10-CM

## 2019-12-31 DIAGNOSIS — M1A00X0 Idiopathic chronic gout, unspecified site, without tophus (tophi): Secondary | ICD-10-CM

## 2019-12-31 MED ORDER — AMLODIPINE BESYLATE 5 MG PO TABS: 5.0000 mg | ORAL_TABLET | Freq: Every day | ORAL | 3 refills | Status: DC

## 2019-12-31 MED ORDER — ALLOPURINOL 300 MG PO TABS: 300.0000 mg | ORAL_TABLET | Freq: Every day | ORAL | 3 refills | Status: DC

## 2019-12-31 MED ORDER — TOUJEO MAX SOLOSTAR 300 UNIT/ML ~~LOC~~ SOPN: 110.0000 [IU] | PEN_INJECTOR | Freq: Every day | SUBCUTANEOUS | 99 refills | Status: DC

## 2019-12-31 MED ORDER — METFORMIN HCL 500 MG PO TABS: 1000.0000 mg | ORAL_TABLET | Freq: Two times a day (BID) | ORAL | 3 refills | Status: DC

## 2019-12-31 MED ORDER — BD PEN NEEDLE NANO U/F 32G X 4 MM MISC: 1 refills | Status: DC

## 2019-12-31 MED ORDER — NAPROXEN 500 MG PO TABS: 500.0000 mg | ORAL_TABLET | Freq: Two times a day (BID) | ORAL | 3 refills | Status: DC

## 2019-12-31 MED ORDER — ATORVASTATIN CALCIUM 40 MG PO TABS: 40.0000 mg | ORAL_TABLET | Freq: Every day | ORAL | 3 refills | Status: DC

## 2019-12-31 MED ORDER — GLUCOSE BLOOD VI STRP: ORAL_STRIP | 99 refills | Status: DC
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
## 2020-02-18 ENCOUNTER — Encounter: Payer: Self-pay | Admitting: Osteopathic Medicine

## 2020-04-06 ENCOUNTER — Other Ambulatory Visit: Payer: Self-pay

## 2020-04-06 DIAGNOSIS — Z794 Long term (current) use of insulin: Secondary | ICD-10-CM

## 2020-04-06 DIAGNOSIS — I10 Essential (primary) hypertension: Secondary | ICD-10-CM

## 2020-04-06 DIAGNOSIS — M1A00X0 Idiopathic chronic gout, unspecified site, without tophus (tophi): Secondary | ICD-10-CM

## 2020-04-06 DIAGNOSIS — I2510 Atherosclerotic heart disease of native coronary artery without angina pectoris: Secondary | ICD-10-CM

## 2020-04-06 DIAGNOSIS — E1159 Type 2 diabetes mellitus with other circulatory complications: Secondary | ICD-10-CM

## 2020-04-06 MED ORDER — TOUJEO MAX SOLOSTAR 300 UNIT/ML ~~LOC~~ SOPN: 110.0000 [IU] | PEN_INJECTOR | Freq: Every day | SUBCUTANEOUS | 1 refills | Status: DC

## 2020-04-06 MED ORDER — ATORVASTATIN CALCIUM 40 MG PO TABS: 40.0000 mg | ORAL_TABLET | Freq: Every day | ORAL | 1 refills | Status: DC

## 2020-04-06 MED ORDER — GLUCOSE BLOOD VI STRP: ORAL_STRIP | 99 refills | Status: DC

## 2020-04-06 MED ORDER — ANDRODERM 2 MG/24HR TD PT24: MEDICATED_PATCH | TRANSDERMAL | 0 refills | Status: DC

## 2020-04-06 MED ORDER — ALLOPURINOL 300 MG PO TABS: 300.0000 mg | ORAL_TABLET | Freq: Every day | ORAL | 1 refills | Status: DC

## 2020-04-06 MED ORDER — AMLODIPINE BESYLATE 5 MG PO TABS: 5.0000 mg | ORAL_TABLET | Freq: Every day | ORAL | 1 refills | Status: DC

## 2020-04-06 NOTE — Telephone Encounter (Signed)
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 Per pt, requesting that all his medications be transferred to [place] immediately. Task completed. Pt is also requesting androgel rx to be sent to [facility]. Rx pended.

## 2020-04-09 ENCOUNTER — Other Ambulatory Visit: Payer: Self-pay

## 2020-04-09 DIAGNOSIS — E1159 Type 2 diabetes mellitus with other circulatory complications: Secondary | ICD-10-CM

## 2020-04-09 DIAGNOSIS — Z794 Long term (current) use of insulin: Secondary | ICD-10-CM

## 2020-04-09 DIAGNOSIS — M1A00X0 Idiopathic chronic gout, unspecified site, without tophus (tophi): Secondary | ICD-10-CM

## 2020-04-09 MED ORDER — BD PEN NEEDLE NANO U/F 32G X 4 MM MISC: 1 refills | Status: DC

## 2020-04-09 MED ORDER — NAPROXEN 500 MG PO TABS: 500.0000 mg | ORAL_TABLET | Freq: Two times a day (BID) | ORAL | 3 refills | Status: DC

## 2020-04-09 MED ORDER — METFORMIN HCL 500 MG PO TABS: 1000.0000 mg | ORAL_TABLET | Freq: Two times a day (BID) | ORAL | 3 refills | Status: DC

## 2020-04-14 ENCOUNTER — Other Ambulatory Visit: Payer: Self-pay

## 2020-04-14 DIAGNOSIS — Z794 Long term (current) use of insulin: Secondary | ICD-10-CM

## 2020-04-14 DIAGNOSIS — E1159 Type 2 diabetes mellitus with other circulatory complications: Secondary | ICD-10-CM

## 2020-04-14 MED ORDER — TOUJEO MAX SOLOSTAR 300 UNIT/ML ~~LOC~~ SOPN: 110.0000 [IU] | PEN_INJECTOR | Freq: Every day | SUBCUTANEOUS | 3 refills | Status: DC

## 2020-05-29 ENCOUNTER — Other Ambulatory Visit: Payer: Self-pay

## 2020-05-29 DIAGNOSIS — Z794 Long term (current) use of insulin: Secondary | ICD-10-CM

## 2020-05-29 DIAGNOSIS — E1159 Type 2 diabetes mellitus with other circulatory complications: Secondary | ICD-10-CM

## 2020-05-29 MED ORDER — TOUJEO MAX SOLOSTAR 300 UNIT/ML ~~LOC~~ SOPN: 110.0000 [IU] | PEN_INJECTOR | Freq: Every day | SUBCUTANEOUS | 1 refills | Status: DC

## 2020-06-22 ENCOUNTER — Other Ambulatory Visit: Payer: Self-pay

## 2020-06-22 DIAGNOSIS — Z794 Long term (current) use of insulin: Secondary | ICD-10-CM | POA: Diagnosis not present

## 2020-06-22 DIAGNOSIS — I10 Essential (primary) hypertension: Secondary | ICD-10-CM | POA: Diagnosis not present

## 2020-06-22 DIAGNOSIS — E1159 Type 2 diabetes mellitus with other circulatory complications: Secondary | ICD-10-CM

## 2020-06-22 DIAGNOSIS — E291 Testicular hypofunction: Secondary | ICD-10-CM

## 2020-06-22 NOTE — Progress Notes (Signed)
Labs only

## 2020-06-23 LAB — CBC WITH DIFFERENTIAL/PLATELET
Absolute Monocytes: 540 cells/uL (ref 200–950)
Basophils Absolute: 141 cells/uL (ref 0–200)
Basophils Relative: 1.9 %
Eosinophils Absolute: 340 cells/uL (ref 15–500)
Eosinophils Relative: 4.6 %
HCT: 40.3 % (ref 38.5–50.0)
Hemoglobin: 13.7 g/dL (ref 13.2–17.1)
Lymphs Abs: 1991 cells/uL (ref 850–3900)
MCH: 30 pg (ref 27.0–33.0)
MCHC: 34 g/dL (ref 32.0–36.0)
MCV: 88.2 fL (ref 80.0–100.0)
MPV: 10.9 fL (ref 7.5–12.5)
Monocytes Relative: 7.3 %
Neutro Abs: 4388 cells/uL (ref 1500–7800)
Neutrophils Relative %: 59.3 %
Platelets: 202 10*3/uL (ref 140–400)
RBC: 4.57 10*6/uL (ref 4.20–5.80)
RDW: 13.5 % (ref 11.0–15.0)
Total Lymphocyte: 26.9 %
WBC: 7.4 10*3/uL (ref 3.8–10.8)

## 2020-06-23 LAB — COMPLETE METABOLIC PANEL WITHOUT GFR
AG Ratio: 2.1 (calc) (ref 1.0–2.5)
ALT: 17 U/L (ref 9–46)
AST: 22 U/L (ref 10–35)
Albumin: 4.4 g/dL (ref 3.6–5.1)
Alkaline phosphatase (APISO): 73 U/L (ref 35–144)
BUN: 12 mg/dL (ref 7–25)
CO2: 26 mmol/L (ref 20–32)
Calcium: 9.6 mg/dL (ref 8.6–10.3)
Chloride: 108 mmol/L (ref 98–110)
Creat: 0.93 mg/dL (ref 0.70–1.18)
GFR, Est African American: 96 mL/min/1.73m2 (ref >=60)
GFR, Est Non African American: 83 mL/min/1.73m2 (ref >=60)
Globulin: 2.1 g/dL (ref 1.9–3.7)
Glucose, Bld: 97 mg/dL (ref 65–99)
Potassium: 4.6 mmol/L (ref 3.5–5.3)
Sodium: 141 mmol/L (ref 135–146)
Total Bilirubin: 0.6 mg/dL (ref 0.2–1.2)
Total Protein: 6.5 g/dL (ref 6.1–8.1)

## 2020-06-23 LAB — TESTOSTERONE: Testosterone: 371 ng/dL (ref 250–827)

## 2020-06-23 LAB — HEMOGLOBIN A1C
Hgb A1c MFr Bld: 5.9 %{Hb} — ABNORMAL HIGH (ref <5.7)
Mean Plasma Glucose: 123 mg/dL
eAG (mmol/L): 6.8 mmol/L

## 2020-06-25 DIAGNOSIS — Z961 Presence of intraocular lens: Secondary | ICD-10-CM | POA: Diagnosis not present

## 2020-06-25 DIAGNOSIS — H353131 Nonexudative age-related macular degeneration, bilateral, early dry stage: Secondary | ICD-10-CM | POA: Diagnosis not present

## 2020-06-25 DIAGNOSIS — H40013 Open angle with borderline findings, low risk, bilateral: Secondary | ICD-10-CM | POA: Diagnosis not present

## 2020-06-25 DIAGNOSIS — E119 Type 2 diabetes mellitus without complications: Secondary | ICD-10-CM | POA: Diagnosis not present

## 2020-06-25 LAB — HM DIABETES EYE EXAM

## 2020-06-30 ENCOUNTER — Ambulatory Visit (INDEPENDENT_AMBULATORY_CARE_PROVIDER_SITE_OTHER): Payer: Medicare Other | Admitting: Osteopathic Medicine

## 2020-06-30 ENCOUNTER — Encounter: Payer: Self-pay | Admitting: Osteopathic Medicine

## 2020-06-30 ENCOUNTER — Other Ambulatory Visit: Payer: Self-pay

## 2020-06-30 VITALS — BP 134/73 | HR 81 | Temp 98.0°F | Wt 203.0 lb

## 2020-06-30 DIAGNOSIS — I714 Abdominal aortic aneurysm, without rupture, unspecified: Secondary | ICD-10-CM

## 2020-06-30 DIAGNOSIS — Z0000 Encounter for general adult medical examination without abnormal findings: Secondary | ICD-10-CM

## 2020-06-30 DIAGNOSIS — Z794 Long term (current) use of insulin: Secondary | ICD-10-CM

## 2020-06-30 DIAGNOSIS — I2510 Atherosclerotic heart disease of native coronary artery without angina pectoris: Secondary | ICD-10-CM

## 2020-06-30 DIAGNOSIS — M1A00X0 Idiopathic chronic gout, unspecified site, without tophus (tophi): Secondary | ICD-10-CM | POA: Diagnosis not present

## 2020-06-30 DIAGNOSIS — E1159 Type 2 diabetes mellitus with other circulatory complications: Secondary | ICD-10-CM

## 2020-06-30 DIAGNOSIS — I10 Essential (primary) hypertension: Secondary | ICD-10-CM

## 2020-06-30 MED ORDER — TOUJEO MAX SOLOSTAR 300 UNIT/ML ~~LOC~~ SOPN: 110.0000 [IU] | PEN_INJECTOR | Freq: Every day | SUBCUTANEOUS | 3 refills | Status: DC

## 2020-06-30 MED ORDER — ANDRODERM 2 MG/24HR TD PT24: MEDICATED_PATCH | TRANSDERMAL | 1 refills | Status: DC

## 2020-06-30 MED ORDER — ATORVASTATIN CALCIUM 40 MG PO TABS: 40.0000 mg | ORAL_TABLET | Freq: Every day | ORAL | 1 refills | Status: DC

## 2020-06-30 MED ORDER — AMLODIPINE BESYLATE 5 MG PO TABS: 5.0000 mg | ORAL_TABLET | Freq: Every day | ORAL | 3 refills | Status: DC

## 2020-06-30 MED ORDER — METFORMIN HCL 500 MG PO TABS: 1000.0000 mg | ORAL_TABLET | Freq: Two times a day (BID) | ORAL | 3 refills | Status: DC

## 2020-06-30 MED ORDER — ALLOPURINOL 300 MG PO TABS: 300.0000 mg | ORAL_TABLET | Freq: Every day | ORAL | 3 refills | Status: DC

## 2020-06-30 NOTE — Patient Instructions (Addendum)
General Preventive Care  Most recent routine screening labs: done 06/13/2020!   Blood pressure goal 130/80 or at least <140/90. Good!   Tobacco: don't! Please let me know if you need help quitting!  Alcohol: responsible moderation is ok for most adults - if you have concerns about your alcohol intake, please talk to me!   Exercise: as tolerated to reduce risk of cardiovascular disease and diabetes. Strength training will also prevent osteoporosis.   Mental health: if need for mental health care (medicines, counseling, other), or concerns about moods, please let me know!   Sexual / Reproductive health: if need for STD testing, or if concerns with libido/pain problems, please let me know!   Advanced Directive: Living Will and/or Healthcare Power of Attorney recommended for all adults, regardless of age or health.  Vaccines  Flu vaccine: done!   Shingles vaccine: due after age 80.   Pneumonia vaccines: due after age 71.  Tetanus booster: due 06/2019! Please update this if wound/bite/puncture   COVID vaccine: THANKS for getting your vaccine! :)  Cancer screenings   Colon cancer screening: for everyone age 47-75. Colonoscopy available for all, many people also qualify for the Cologuard stool test. Will request records!   Prostate cancer screening: PSA blood test age 6-71  Lung cancer screening: CT chest every year for those aged 53 to 71 years who have a 20 pack-year smoking history and currently smoke or have quit within the past 15 years  Infection screenings  . HIV: recommended screening at least once age 76-65, more often as needed. . Gonorrhea/Chlamydia: screening as needed . Hepatitis C: recommended once for everyone age 92-75 Other . Bone Density Test: recommended for men at age 72 . Abdominal Aortic Aneurysm: follow-up is due, ordered! Call [phone]
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
## 2020-07-01 ENCOUNTER — Ambulatory Visit: Payer: Medicare Other | Admitting: Osteopathic Medicine

## 2020-07-02 ENCOUNTER — Ambulatory Visit (INDEPENDENT_AMBULATORY_CARE_PROVIDER_SITE_OTHER): Payer: Medicare Other

## 2020-07-02 ENCOUNTER — Other Ambulatory Visit: Payer: Self-pay

## 2020-07-02 DIAGNOSIS — I714 Abdominal aortic aneurysm, without rupture: Secondary | ICD-10-CM | POA: Diagnosis not present

## 2020-07-06 ENCOUNTER — Encounter: Payer: Self-pay | Admitting: *Deleted

## 2020-10-01 ENCOUNTER — Other Ambulatory Visit: Payer: Self-pay | Admitting: Osteopathic Medicine

## 2020-10-01 DIAGNOSIS — Z794 Long term (current) use of insulin: Secondary | ICD-10-CM

## 2020-10-01 DIAGNOSIS — E1159 Type 2 diabetes mellitus with other circulatory complications: Secondary | ICD-10-CM

## 2020-12-22 ENCOUNTER — Telehealth: Payer: Self-pay

## 2020-12-22 DIAGNOSIS — Z0189 Encounter for other specified special examinations: Secondary | ICD-10-CM

## 2020-12-22 DIAGNOSIS — I10 Essential (primary) hypertension: Secondary | ICD-10-CM

## 2020-12-22 DIAGNOSIS — I2510 Atherosclerotic heart disease of native coronary artery without angina pectoris: Secondary | ICD-10-CM

## 2020-12-22 DIAGNOSIS — M1A00X0 Idiopathic chronic gout, unspecified site, without tophus (tophi): Secondary | ICD-10-CM

## 2020-12-22 DIAGNOSIS — E781 Pure hyperglyceridemia: Secondary | ICD-10-CM

## 2020-12-22 DIAGNOSIS — E291 Testicular hypofunction: Secondary | ICD-10-CM

## 2020-12-22 NOTE — Telephone Encounter (Signed)
Orders are in, thanks

## 2020-12-22 NOTE — Telephone Encounter (Signed)
Patient's appt got moved one week later due to some scheduling issues. He would still like to have his fasting labs drawn on the original date. Please enter lab orders.   Patient also requested to get his 2nd [place] booster when he comes to the office for his appt. When I let him know the orders are in, I will let him know that we don't do the vaccine here.

## 2020-12-22 NOTE — Chronic Care Management (AMB) (Signed)
  Chronic Care Management   Note  12/22/2020 Name: [name] MRN: [id number] DOB: [birth year]  [name] is a 70 y.o. year old male who is a primary care patient of Emeterio Reeve, DO. I reached out to [name] by phone today in response to a referral sent by [name]'s PCP, Emeterio Reeve, DO      [name] was given information about Chronic Care Management services today including:  CCM service includes personalized support from designated clinical staff supervised by his physician, including individualized plan of care and coordination with other care providers 24/7 contact phone numbers for assistance for urgent and routine care needs. Service will only be billed when office clinical staff spend 20 minutes or more in a month to coordinate care. Only one practitioner may furnish and bill the service in a calendar month. The patient may stop CCM services at any time (effective at the end of the month) by phone call to the office staff. The patient will be responsible for cost sharing (co-pay) of up to 20% of the service fee (after annual deductible is met).  Patient agreed to services and verbal consent obtained.   Follow up plan: Patient declines  engagement by the care management team. Appropriate care team members and provider have been notified via electronic communication.   Noreene Larsson, [address], [place], [place] [address] Direct Dial: [phone] [email or web address]

## 2020-12-23 NOTE — Telephone Encounter (Signed)
Patient aware the orders are in for his labs and that he can now come to our office for this. Advised him that we do not have the COVID vaccine in office to give him his 2nd booster. He requested to have this antibodies checked when he does his bloodwork. Please add this to this lab tests.

## 2020-12-28 ENCOUNTER — Ambulatory Visit: Payer: Medicare Other | Admitting: Osteopathic Medicine

## 2020-12-29 LAB — URIC ACID: Uric Acid, Serum: 4.3 mg/dL (ref 4.0–8.0)

## 2020-12-30 LAB — TESTOSTERONE: Testosterone: 297 ng/dL (ref 250–827)

## 2020-12-30 LAB — CBC
HCT: 40.7 % (ref 38.5–50.0)
Hemoglobin: 13.5 g/dL (ref 13.2–17.1)
MCH: 28.8 pg (ref 27.0–33.0)
MCHC: 33.2 g/dL (ref 32.0–36.0)
MCV: 86.8 fL (ref 80.0–100.0)
MPV: 10.8 fL (ref 7.5–12.5)
Platelets: 197 10*3/uL (ref 140–400)
RBC: 4.69 10*6/uL (ref 4.20–5.80)
RDW: 14.4 % (ref 11.0–15.0)
WBC: 5.5 10*3/uL (ref 3.8–10.8)

## 2020-12-30 LAB — COMPLETE METABOLIC PANEL WITH GFR
AG Ratio: 1.7 (calc) (ref 1.0–2.5)
ALT: 28 U/L (ref 9–46)
AST: 36 U/L — ABNORMAL HIGH (ref 10–35)
Albumin: 4.3 g/dL (ref 3.6–5.1)
Alkaline phosphatase (APISO): 94 U/L (ref 35–144)
BUN: 15 mg/dL (ref 7–25)
CO2: 24 mmol/L (ref 20–32)
Calcium: 9.5 mg/dL (ref 8.6–10.3)
Chloride: 107 mmol/L (ref 98–110)
Creat: 0.98 mg/dL (ref 0.70–1.28)
Globulin: 2.5 g/dL (calc) (ref 1.9–3.7)
Glucose, Bld: 205 mg/dL — ABNORMAL HIGH (ref 65–99)
Potassium: 4.7 mmol/L (ref 3.5–5.3)
Sodium: 139 mmol/L (ref 135–146)
Total Bilirubin: 0.4 mg/dL (ref 0.2–1.2)
Total Protein: 6.8 g/dL (ref 6.1–8.1)
eGFR: 82 mL/min/{1.73_m2} (ref >=60)

## 2020-12-30 LAB — LIPID PANEL
Cholesterol: 121 mg/dL (ref <200)
HDL: 30 mg/dL — ABNORMAL LOW (ref >=40)
LDL Cholesterol (Calc): 60 mg/dL (calc)
Non-HDL Cholesterol (Calc): 91 mg/dL (calc) (ref <130)
Total CHOL/HDL Ratio: 4 (calc) (ref <5.0)
Triglycerides: 294 mg/dL — ABNORMAL HIGH (ref <150)

## 2020-12-30 LAB — PSA, TOTAL WITH REFLEX TO PSA, FREE: PSA, Total: 0.4 ng/mL (ref <=4.0)

## 2020-12-30 LAB — HEMOGLOBIN A1C
Hgb A1c MFr Bld: 7.5 % of total Hgb — ABNORMAL HIGH (ref <5.7)
Mean Plasma Glucose: 169 mg/dL
eAG (mmol/L): 9.3 mmol/L

## 2020-12-30 LAB — SARS COV-2 SEROLOGY(COVID-19)AB(IGG,IGM),IMMUNOASSAY
SARS CoV-2 AB IgG: NEGATIVE
SARS CoV-2 IgM: NEGATIVE

## 2021-01-01 ENCOUNTER — Other Ambulatory Visit: Payer: Self-pay | Admitting: Osteopathic Medicine

## 2021-01-01 DIAGNOSIS — Z794 Long term (current) use of insulin: Secondary | ICD-10-CM

## 2021-01-01 DIAGNOSIS — E1159 Type 2 diabetes mellitus with other circulatory complications: Secondary | ICD-10-CM

## 2021-01-01 DIAGNOSIS — M1A00X0 Idiopathic chronic gout, unspecified site, without tophus (tophi): Secondary | ICD-10-CM

## 2021-01-01 DIAGNOSIS — I2510 Atherosclerotic heart disease of native coronary artery without angina pectoris: Secondary | ICD-10-CM

## 2021-01-01 DIAGNOSIS — I10 Essential (primary) hypertension: Secondary | ICD-10-CM

## 2021-01-04 ENCOUNTER — Encounter: Payer: Self-pay | Admitting: Osteopathic Medicine

## 2021-01-04 ENCOUNTER — Ambulatory Visit (INDEPENDENT_AMBULATORY_CARE_PROVIDER_SITE_OTHER): Payer: Medicare Other | Admitting: Osteopathic Medicine

## 2021-01-04 VITALS — BP 134/71 | HR 94 | Temp 98.0°F | Wt 211.1 lb

## 2021-01-04 DIAGNOSIS — I714 Abdominal aortic aneurysm, without rupture, unspecified: Secondary | ICD-10-CM

## 2021-01-04 DIAGNOSIS — E1159 Type 2 diabetes mellitus with other circulatory complications: Secondary | ICD-10-CM

## 2021-01-04 DIAGNOSIS — E781 Pure hyperglyceridemia: Secondary | ICD-10-CM | POA: Diagnosis not present

## 2021-01-04 DIAGNOSIS — M1A00X0 Idiopathic chronic gout, unspecified site, without tophus (tophi): Secondary | ICD-10-CM

## 2021-01-04 DIAGNOSIS — I10 Essential (primary) hypertension: Secondary | ICD-10-CM

## 2021-01-04 DIAGNOSIS — I2510 Atherosclerotic heart disease of native coronary artery without angina pectoris: Secondary | ICD-10-CM

## 2021-01-04 DIAGNOSIS — Z951 Presence of aortocoronary bypass graft: Secondary | ICD-10-CM

## 2021-01-04 DIAGNOSIS — E291 Testicular hypofunction: Secondary | ICD-10-CM | POA: Diagnosis not present

## 2021-01-04 DIAGNOSIS — Z794 Long term (current) use of insulin: Secondary | ICD-10-CM

## 2021-01-04 NOTE — Progress Notes (Signed)
[name] is a 70 y.o. male who presents to  [place] at [facility]  today, 01/04/21, seeking care for the following:  Follow-up chronic conditions, see below     ASSESSMENT & PLAN with other pertinent findings:  The primary encounter diagnosis was Type 2 diabetes mellitus with other circulatory complication, with long-term current use of insulin ([place]). 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   1. Type 2 diabetes mellitus with other circulatory complication, with long-term current use of insulin (HCC) A1c up a bit from previous, 6 months ago was 5.9, currently at 7.5.  Patient reports not doing as well with diet/exercise, given his wife's dietary restrictions as she is going through cancer treatment he is not eating his usual high vegetable diet with a lot of salads.  Given A1c is not dramatically elevated and is actually at goal for age, I am not too worried about this.  Patient hopes that once she is through her cancer treatments their diet/lifestyle can go back to normal.  I think okay to continue current regimen  2. Essential hypertension BP Readings from Last 3 Encounters:  01/04/21 134/71  06/30/20 134/73  12/31/19 125/74   3. Testosterone deficiency in male Not currently taking testosterone, which explains low levels.  Okay to hold off on testosterone for now  4. HypertriglyceridemiA 5. Atherosclerosis of native coronary artery of native heart without angina pectoris 6. HYPERTENSION, BENIGN 8. AAA (abdominal aortic aneurysm) without rupture ([place]) 9. S/P CABG x 6 Following as directed with cardiology, compliant  with aspirin, statin, not currently on beta-blocker or ACE/ARB  7. 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  No orders of the defined types were placed in this encounter.   No orders of the defined types were placed in this encounter.    See below for relevant physical exam findings  See below for recent lab and imaging results reviewed  Medications, allergies, PMH, PSH, SocH, [place] reviewed below    Follow-up instructions: Return in about 3 months (around 04/06/2021) for RECHECK LABS: LIPIDS, A1C.                                        Exam:  BP 134/71 (BP Location: Left Arm, Patient Position: Sitting, Cuff Size: Normal)   Pulse 94   Temp 98 F (36.7 C) (Oral)   Wt 211 lb 1.3 oz (95.7 kg)   BMI 31.17 kg/m  Constitutional: VS see above. General Appearance: alert, well-developed, well-nourished, NAD Neck: No masses, trachea midline.  Respiratory: Normal respiratory effort. no wheeze, no rhonchi, no rales Cardiovascular: S1/S2 normal, no murmur, no rub/gallop auscultated. RRR.  Musculoskeletal: Gait normal. Symmetric and independent movement of all extremities Neurological: Normal balance/coordination. No tremor. Skin: warm, dry, intact.  Psychiatric: Normal judgment/insight. Normal mood and affect. Oriented x3.   Current Meds  Medication Sig   aspirin 325 MG tablet Take 225 mg by mouth daily.   BD PEN NEEDLE NANO 2ND GEN 32G X 4 MM MISC USE TO INJECT INSULIN DAILY  Blood Glucose Monitoring Suppl (BLOOD GLUCOSE METER KIT AND SUPPLIES) KIT Please check blood sugar twice daily at different times of the day and record.   glucose blood test strip Test blood sugar twice daily at different times and record.   insulin glargine, 2 Unit Dial, (TOUJEO MAX SOLOSTAR) 300 UNIT/ML Solostar Pen Inject 110-120 Units into the skin  daily.   Lancets MISC Test blood sugar twice daily at different times and record.   metFORMIN (GLUCOPHAGE) 500 MG tablet Take 2 tablets (1,000 mg total) by mouth 2 (two) times daily with a meal.   naproxen (NAPROSYN) 500 MG tablet Take 1 tablet (500 mg total) by mouth 2 (two) times daily with a meal.   Testosterone (ANDRODERM) 2 MG/24HR PT24 APPLY 1 PATCH ONTO THE SKIN EVERY DAY   [DISCONTINUED] allopurinol (ZYLOPRIM) 300 MG tablet Take 1 tablet (300 mg total) by mouth daily.   [DISCONTINUED] amLODipine (NORVASC) 5 MG tablet Take 1 tablet (5 mg total) by mouth daily.   [DISCONTINUED] atorvastatin (LIPITOR) 40 MG tablet Take 1 tablet (40 mg total) by mouth daily.    Allergies  Allergen Reactions   Ace Inhibitors Other (See Comments)    Severe drop in BP per patient   Colchicine    Fenofibrate    Statins     REACTION: severe drop in BP   Sulfa Antibiotics     Patient Active Problem List   Diagnosis Date Noted   Rupture of right proximal biceps tendon 11/22/2019   Impingement syndrome, shoulder, right 11/22/2019   Trigger finger, right little finger 06/26/2018   Pneumococcal vaccination declined 05/22/2018   Testosterone deficiency in male 02/20/2018   Tobacco abuse 07/25/2012   HYPERTENSION, BENIGN 02/04/2009   CAD, NATIVE VESSEL 02/04/2009   AAA (abdominal aortic aneurysm) without rupture ([place]) 02/04/2009   Diabetes mellitus ([place]) 12/12/2008   HYPERTRIGLYCERIDEMIA 12/12/2008   Hypertriglyceridemia 12/12/2008   GOUT 12/12/2008   Essential hypertension 12/12/2008   NEPHROLITHIASIS, HX OF 12/12/2008   S/P CABG x 6 12/12/2008    Family History  Problem Relation Age of Onset   Heart attack Mother    AAA (abdominal aortic aneurysm) Father    Aneurysm Brother    Aneurysm Brother    Aneurysm Brother    Hypertension Sister     Social History   Tobacco Use  Smoking Status Former   Packs/day: 1.00   Years: 53.00   Pack years: 53.00   Types: Cigarettes   Quit date:  04/10/2016   Years since quitting: 4.7  Smokeless Tobacco Never    Past Surgical History:  Procedure Laterality Date   BACK SURGERY     Low back surgery   CORONARY ARTERY BYPASS GRAFT  November 21, 2008   STAPEDECTOMY     Right ear stapedectomy--because of this, he cannot have an MRI    Immunization History  Administered Date(s) Administered   Fluad Quad(high Dose 65+) 02/21/2019   Influenza Split 02/28/2012   Influenza, High Dose Seasonal PF 03/28/2016, 04/10/2017, 03/29/2018   Influenza,inj,Quad PF,6+ Mos 04/07/2014, 04/23/2015   Influenza-Unspecified 03/28/2016, 03/29/2018, 05/02/2020   PFIZER(Purple Top)SARS-COV-2 Vaccination 08/30/2019, 09/20/2019, 05/04/2020    Recent Results (from the past 2160 hour(s))  CBC     Status: None   Collection Time: 12/29/20 12:00 AM  Result Value Ref Range   WBC 5.5 3.8 - 10.8 Thousand/uL   RBC 4.69 4.20 - 5.80 Million/uL   Hemoglobin 13.5 13.2 - 17.1 g/dL   HCT 40.7 38.5 - 50.0 %  MCV 86.8 80.0 - 100.0 fL   MCH 28.8 27.0 - 33.0 pg   MCHC 33.2 32.0 - 36.0 g/dL   RDW 14.4 11.0 - 15.0 %   Platelets 197 140 - 400 Thousand/uL   MPV 10.8 7.5 - 12.5 fL  COMPLETE METABOLIC PANEL WITH GFR     Status: Abnormal   Collection Time: 12/29/20 12:00 AM  Result Value Ref Range   Glucose, Bld 205 (H) 65 - 99 mg/dL    Comment: .            Fasting reference interval . For someone without known diabetes, a glucose value >125 mg/dL indicates that they may have diabetes and this should be confirmed with a follow-up test. .    BUN 15 7 - 25 mg/dL   Creat 0.98 0.70 - 1.28 mg/dL   eGFR 82 > OR = 60 mL/min/1.24m2    Comment: The eGFR is based on the CKD-EPI 2021 equation. To calculate  the new eGFR from a previous Creatinine or Cystatin C result, go to https://www.kidney.org/professionals/ kdoqi/gfr%5Fcalculator    BUN/Creatinine Ratio NOT APPLICABLE 6 - 22 (calc)   Sodium 139 135 - 146 mmol/L   Potassium 4.7 3.5 - 5.3 mmol/L   Chloride 107 98 -  110 mmol/L   CO2 24 20 - 32 mmol/L   Calcium 9.5 8.6 - 10.3 mg/dL   Total Protein 6.8 6.1 - 8.1 g/dL   Albumin 4.3 3.6 - 5.1 g/dL   Globulin 2.5 1.9 - 3.7 g/dL (calc)   AG Ratio 1.7 1.0 - 2.5 (calc)   Total Bilirubin 0.4 0.2 - 1.2 mg/dL   Alkaline phosphatase (APISO) 94 35 - 144 U/L   AST 36 (H) 10 - 35 U/L   ALT 28 9 - 46 U/L  Lipid panel     Status: Abnormal   Collection Time: 12/29/20 12:00 AM  Result Value Ref Range   Cholesterol 121 <200 mg/dL   HDL 30 (L) > OR = 40 mg/dL   Triglycerides 294 (H) <150 mg/dL    Comment: . If a non-fasting specimen was collected, consider repeat triglyceride testing on a fasting specimen if clinically indicated.  Yates Decamp et al. J. of Clin. Lipidol. 6294;7:654-650. Marland Kitchen    LDL Cholesterol (Calc) 60 mg/dL (calc)    Comment: Reference range: <100 . Desirable range <100 mg/dL for primary prevention;   <70 mg/dL for patients with CHD or diabetic patients  with > or = 2 CHD risk factors. Marland Kitchen LDL-C is now calculated using the Martin-Hopkins  calculation, which is a validated novel method providing  better accuracy than the Friedewald equation in the  estimation of LDL-C.  Cresenciano Genre et al. Annamaria Helling. 3546;568(12): 2061-2068  (http://education.[facility].com/faq/FAQ164)    Total CHOL/HDL Ratio 4.0 <5.0 (calc)   Non-HDL Cholesterol (Calc) 91 <130 mg/dL (calc)    Comment: For patients with diabetes plus 1 major ASCVD risk  factor, treating to a non-HDL-C goal of <100 mg/dL  (LDL-C of <70 mg/dL) is considered a therapeutic  option.   Hemoglobin A1c     Status: Abnormal   Collection Time: 12/29/20 12:00 AM  Result Value Ref Range   Hgb A1c MFr Bld 7.5 (H) <5.7 % of total Hgb    Comment: For someone without known diabetes, a hemoglobin A1c value of 6.5% or greater indicates that they may have  diabetes and this should be confirmed with a follow-up  test. . For someone with known diabetes, a value <7% indicates  that their  diabetes is well  controlled and a value  greater than or equal to 7% indicates suboptimal  control. A1c targets should be individualized based on  duration of diabetes, age, comorbid conditions, and  other considerations. . Currently, no consensus exists regarding use of hemoglobin A1c for diagnosis of diabetes for children. .    Mean Plasma Glucose 169 mg/dL   eAG (mmol/L) 9.3 mmol/L  PSA, Total with Reflex to PSA, Free     Status: None   Collection Time: 12/29/20 12:00 AM  Result Value Ref Range   PSA, Total 0.4 < OR = 4.0 ng/mL    Comment: The Total PSA value from this assay system is  standardized against the equimolar PSA standard.  The test result will be approximately 20% higher  when compared to the [facility] Total PSA  (Siemens assay). Comparison of serial PSA results  should be interpreted with this fact in mind. Marland Kitchen PSA was performed using the Beckman Coulter  Immunoassay method. Values obtained from different  assay methods cannot be used interchangeably. PSA  levels, regardless of value, should not be  interpreted as absolute evidence of the presence or  absence of disease.   Testosterone     Status: None   Collection Time: 12/29/20 12:00 AM  Result Value Ref Range   Testosterone 297 250 - 827 ng/dL  Uric acid     Status: None   Collection Time: 12/29/20 12:00 AM  Result Value Ref Range   Uric Acid, Serum 4.3 4.0 - 8.0 mg/dL    Comment: Therapeutic target for gout patients: <6.0 mg/dL .   SARS CoV2 Serology(COVID19) AB(IgG,IgM),Immunoassay     Status: None   Collection Time: 12/29/20 12:00 AM  Result Value Ref Range   SARS CoV-2 AB IgG NEGATIVE NEGATIVE   SARS CoV-2 IgM NEGATIVE NEGATIVE    Comment: . Reference range: Negative . These tests are intended for use as an aid in identifying individuals with an adaptive immune response to SARS-CoV-2,  indicating recent or prior infection. Results are for the detection of SARS-CoV-2 IgG and IgM antibodies.  IgM  antibodies to SARS-CoV-2 are generally detectable in blood several days after initial infection, with IgG antibodies typically reaching detectable levels a few days later.  The duration of time antibodies are present post-infection is not well characterized. At this time, it is unknown how long IgG and IgM antibodies persist following infection, or if the presence of antibodies confers protective immunity. Individuals may have detectable virus present for several weeks following seroconversion.  Negative results for antibodies do not preclude acute SARS-CoV-2 infection.  These tests should not be used to diagnose acute SARS-CoV-2 infection.  If acute infection is suspected, direct testing for SARS-CoV-2 is nece ssary. False positive results for the tests may occur due to cross-reactivity from pre-existing antibodies or other possible causes. The sensitivity of the tests early after infection is unknown. . IgM Result    IgG Result    Interpretation Negative      Negative      Antibodies not detected.                             Does not preclude acute                              SARS-CoV-2 infection. Negative      Positive      Suggests  past exposure                             to SARS-CoV-2. Positive      Negative      Suggests recent exposure                             to SARS-CoV-2. Positive      Positive      Suggests recent exposure                             to SARS-CoV-2. Marland Kitchen Please review the "Fact Sheets" available for health care providers and patients using the following  websites:   [facility]com/home/Covid-19/HCP/antibody/fact-sheet2  [facility]com/home/Covid-19/Patients/antibody/fact-sheet2 [facility]/home/Covid-19/HCP/antibody/fact-s heet6  [facility]com/home/Covid-19/Patients/antibody/fact-sheet6 . These tests have been authorized by the FDA under an Emergency Use Authorization (EUA) for use by authorized laboratories. The FDA  authorized fact sheets are available on the [employer] website:  www.[facility].com/Covid19.   . For additional information please refer to  http://education.[facility].com/faq/FAQ219 (This link is being provided for informational/ educational purposes only.)    . Reference range: Negative . These tests are intended for use as an aid in identifying individuals with an adaptive immune response to SARS-CoV-2,  indicating recent or prior infection. Results are for the detection of SARS-CoV-2 IgG and IgM antibodies.  IgM antibodies to SARS-CoV-2 are generally detectable in blood several days after initial infection, with IgG antibodies typically reaching detectable levels a few days later.  The duration of time antibodies are present post-infecti on is not well characterized. At this time, it is unknown how long IgG and IgM antibodies persist following infection, or if the presence of antibodies confers protective immunity. Individuals may have detectable virus present for several weeks following seroconversion.  Negative results for antibodies do not preclude acute SARS-CoV-2 infection.  These tests should not be used to diagnose acute SARS-CoV-2 infection.  If acute infection is suspected, direct testing for SARS-CoV-2 is necessary. False positive results for the tests may occur due to cross-reactivity from pre-existing antibodies or other possible causes. The sensitivity of the tests early after infection is unknown. . IgM Result    IgG Result    Interpretation Negative      Negative      Antibodies not detected.                             Does not preclude acute                              SARS-CoV-2 infection. Negative      Positive      Suggests past exposure                             to SARS-CoV-2.  Positive      Negative      Suggests recent exposure                             to SARS-CoV-2. Positive      Positive      Suggests recent exposure  to SARS-CoV-2. Marland Kitchen Please review the "Fact Sheets" available for health care providers and patients using the following  websites:   [facility]/home/Covid-19/HCP/antibody/fact-sheet2  [facility]/home/Covid-19/Patients/antibody/fact-sheet2 [facility]/home/Covid-19/HCP/antibody/fact-sheet6  [facility]/home/Covid-19/Patients/antibody/fact-sheet6 . These tests have been authorized by the FDA under an Emergency Use Authorization (EUA) for use by authorized laboratories. The FDA authorized fact sheets are available on the [employer] website:  www.[facility].com/Covid19.   . For additional information please refer to  http://education.[facility].com/faq/FAQ219 (This link is being provided for informational/ educational purposes only.)        No results found.     All questions at time of visit were answered - patient instructed to contact office with any additional concerns or updates. ER/RTC precautions were reviewed with the patient as applicable.   Please note: manual typing as well as voice recognition software may have been used to produce this document - typos may escape review. Please contact Dr. Sheppard Coil for any needed clarifications.

## 2021-04-06 ENCOUNTER — Ambulatory Visit: Payer: Medicare Other | Admitting: Family Medicine

## 2021-04-06 ENCOUNTER — Telehealth: Payer: Self-pay | Admitting: Osteopathic Medicine

## 2021-04-06 ENCOUNTER — Ambulatory Visit: Payer: Medicare Other | Admitting: Osteopathic Medicine

## 2021-04-06 NOTE — Telephone Encounter (Signed)
Pt advised that it appears after reviewing his medication list that he should have enough refills to last him until his appt on 04/20/2021.  Advised pt to contact pharmacy for refills.  Pt expressed understanding and is agreeable.  Charyl Bigger, CMA

## 2021-04-06 NOTE — Telephone Encounter (Signed)
Patient had an appt with Dr Zigmund Daniel today but had to reschedule due to Dr Zigmund Daniel being out of office. Patient was wanting to know could he have refills on his medications to get him by to his next appt date which is scheduled for 04/20/21. Patient did not know which medications, just stated he didn't think he had enough to get him by to the appointment, routing to Alexander's covering provider as well. AM

## 2021-04-14 ENCOUNTER — Other Ambulatory Visit: Payer: Self-pay

## 2021-04-14 DIAGNOSIS — M1A00X0 Idiopathic chronic gout, unspecified site, without tophus (tophi): Secondary | ICD-10-CM

## 2021-04-14 DIAGNOSIS — I10 Essential (primary) hypertension: Secondary | ICD-10-CM

## 2021-04-14 MED ORDER — ALLOPURINOL 300 MG PO TABS: ORAL_TABLET | ORAL | 3 refills | Status: DC

## 2021-04-14 MED ORDER — AMLODIPINE BESYLATE 5 MG PO TABS: ORAL_TABLET | ORAL | 3 refills | Status: DC

## 2021-04-20 ENCOUNTER — Other Ambulatory Visit: Payer: Self-pay

## 2021-04-20 ENCOUNTER — Encounter: Payer: Self-pay | Admitting: Family Medicine

## 2021-04-20 ENCOUNTER — Ambulatory Visit (INDEPENDENT_AMBULATORY_CARE_PROVIDER_SITE_OTHER): Payer: Medicare Other | Admitting: Family Medicine

## 2021-04-20 DIAGNOSIS — I10 Essential (primary) hypertension: Secondary | ICD-10-CM | POA: Diagnosis not present

## 2021-04-20 DIAGNOSIS — I2510 Atherosclerotic heart disease of native coronary artery without angina pectoris: Secondary | ICD-10-CM | POA: Diagnosis not present

## 2021-04-20 DIAGNOSIS — E291 Testicular hypofunction: Secondary | ICD-10-CM

## 2021-04-20 DIAGNOSIS — H905 Unspecified sensorineural hearing loss: Secondary | ICD-10-CM | POA: Insufficient documentation

## 2021-04-20 DIAGNOSIS — Z794 Long term (current) use of insulin: Secondary | ICD-10-CM

## 2021-04-20 DIAGNOSIS — E1159 Type 2 diabetes mellitus with other circulatory complications: Secondary | ICD-10-CM | POA: Diagnosis not present

## 2021-04-20 LAB — POCT GLYCOSYLATED HEMOGLOBIN (HGB A1C): HbA1c, POC (controlled diabetic range): 7.2 % — AB (ref 0.0–7.0)

## 2021-04-20 MED ORDER — TOUJEO MAX SOLOSTAR 300 UNIT/ML ~~LOC~~ SOPN: 110.0000 [IU] | PEN_INJECTOR | Freq: Every day | SUBCUTANEOUS | 3 refills | Status: DC

## 2021-04-20 MED ORDER — ATORVASTATIN CALCIUM 40 MG PO TABS: ORAL_TABLET | ORAL | 3 refills | Status: DC

## 2021-04-20 MED ORDER — METFORMIN HCL 500 MG PO TABS: 1000.0000 mg | ORAL_TABLET | Freq: Two times a day (BID) | ORAL | 3 refills | Status: DC

## 2021-04-20 MED ORDER — ANDRODERM 2 MG/24HR TD PT24: MEDICATED_PATCH | TRANSDERMAL | 1 refills | Status: DC

## 2021-04-20 NOTE — Assessment & Plan Note (Signed)
Controlled diabetes has improved since last visit.  He will continue with dietary change as well as current dose of metformin and Toujeo.

## 2021-04-20 NOTE — Assessment & Plan Note (Signed)
Blood pressure well controlled with amlodipine.  Continue at current strength.

## 2021-04-20 NOTE — Assessment & Plan Note (Signed)
He is doing well with current dose of testosterone.  He is aware of potential cardiac risk associated with testosterone replacement.  Continue current strength.

## 2021-04-20 NOTE — Patient Instructions (Addendum)
Nice to meet you today! Continue current medications See me again in 6 months or sooner if needed.
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
## 2021-04-22 ENCOUNTER — Other Ambulatory Visit: Payer: Self-pay | Admitting: Family Medicine

## 2021-04-26 ENCOUNTER — Other Ambulatory Visit: Payer: Self-pay | Admitting: Family Medicine

## 2021-06-15 ENCOUNTER — Other Ambulatory Visit: Payer: Self-pay | Admitting: Family Medicine

## 2021-06-16 ENCOUNTER — Other Ambulatory Visit: Payer: Self-pay

## 2021-06-16 MED ORDER — NAPROXEN 500 MG PO TABS: 500.0000 mg | ORAL_TABLET | Freq: Two times a day (BID) | ORAL | 2 refills | Status: AC

## 2021-06-22 ENCOUNTER — Other Ambulatory Visit: Payer: Self-pay

## 2021-06-22 DIAGNOSIS — Z794 Long term (current) use of insulin: Secondary | ICD-10-CM

## 2021-06-22 MED ORDER — BD PEN NEEDLE NANO 2ND GEN 32G X 4 MM MISC: 1 refills | Status: DC

## 2021-06-23 ENCOUNTER — Other Ambulatory Visit: Payer: Self-pay | Admitting: *Deleted

## 2021-06-23 DIAGNOSIS — I7143 Infrarenal abdominal aortic aneurysm, without rupture: Secondary | ICD-10-CM

## 2021-06-23 NOTE — Progress Notes (Signed)
US aorta

## 2021-06-29 ENCOUNTER — Other Ambulatory Visit: Payer: Medicare Other

## 2021-07-02 ENCOUNTER — Ambulatory Visit (INDEPENDENT_AMBULATORY_CARE_PROVIDER_SITE_OTHER): Payer: Medicare Other | Admitting: Family Medicine

## 2021-07-02 DIAGNOSIS — Z0000 Encounter for general adult medical examination without abnormal findings: Secondary | ICD-10-CM

## 2021-07-02 NOTE — Patient Instructions (Addendum)
[place] Maintenance Summary and Written Plan of Care  [name] ,  Thank you for allowing me to perform your Medicare Annual Wellness Visit and for your ongoing commitment to your health.   Health Maintenance & Immunization History Health Maintenance  Topic Date Due   COVID-19 Vaccine (5 - Booster for Pfizer series) 07/18/2021 (Originally 06/01/2021)   URINE MICROALBUMIN  08/02/2021 (Originally 05/23/2019)   TETANUS/TDAP  01/04/2022 (Originally 06/14/2019)   COLONOSCOPY (Pts 45-9yrs Insurance coverage will need to be confirmed)  07/02/2022 (Originally 08/27/1994)   HEMOGLOBIN A1C  10/18/2021   FOOT EXAM  04/20/2022   OPHTHALMOLOGY EXAM  06/29/2022   INFLUENZA VACCINE  Completed   Hepatitis C Screening  Completed   HPV VACCINES  Aged Out   Pneumonia Vaccine 46+ Years old  Discontinued   Zoster Vaccines- Shingrix  Discontinued   Immunization History  Administered Date(s) Administered   Fluad Quad(high Dose 65+) 02/21/2019, 04/06/2021   Influenza Split 02/28/2012   Influenza, High Dose Seasonal PF 03/28/2016, 04/10/2017, 03/29/2018   Influenza,inj,Quad PF,6+ Mos 04/07/2014, 04/23/2015   Influenza-Unspecified 03/28/2016, 03/29/2018, 05/02/2020   PFIZER(Purple Top)SARS-COV-2 Vaccination 08/30/2019, 09/20/2019, 05/04/2020, 04/06/2021    These are the patient goals that we discussed:  Goals Addressed               This Visit's Progress     Patient Stated (pt-stated)        Would like to be as healthy as he is and would like to win the lottery.         This is a list of Health Maintenance Items that are overdue or due now: Pneumococcal vaccine  Colorectal cancer screening Shingrix vaccine Tetenus shot  Patient declined all of the immunizations as well as the colorectal cancer screening (colonoscopy and cologuard).  Orders/Referrals Placed Today: No orders of the defined types were placed in this encounter.  (Contact our referral  department at [phone] if you have not spoken with someone about your referral appointment within the next 5 days)    Follow-up Plan Follow-up with [name], DO as planned Medicare wellness in one year. AVS printed and mailed to the patient.      Health Maintenance, Male Adopting a healthy lifestyle and getting preventive care are important in promoting health and wellness. Ask your health care provider about: The right schedule for you to have regular tests and exams. Things you can do on your own to prevent diseases and keep yourself healthy. What should I know about diet, weight, and exercise? Eat a healthy diet  Eat a diet that includes plenty of vegetables, fruits, low-fat dairy products, and lean protein. Do not eat a lot of foods that are high in solid fats, added sugars, or sodium. Maintain a healthy weight Body mass index (BMI) is a measurement that can be used to identify possible weight problems. It estimates body fat based on height and weight. Your health care provider can help determine your BMI and help you achieve or maintain a healthy weight. Get regular exercise Get regular exercise. This is one of the most important things you can do for your health. Most adults should: Exercise for at least 150 minutes each week. The exercise should increase your heart rate and make you sweat (moderate-intensity exercise). Do strengthening exercises at least twice a week. This is in addition to the moderate-intensity exercise. Spend less time sitting. Even light physical activity can be beneficial. Watch cholesterol and blood lipids Have your blood  tested for lipids and cholesterol at 72 years of age, then have this test every 5 years. You may need to have your cholesterol levels checked more often if: Your lipid or cholesterol levels are high. You are older than 72 years of age. You are at high risk for heart disease. What should I know about cancer screening? Many  types of cancers can be detected early and may often be prevented. Depending on your health history and family history, you may need to have cancer screening at various ages. This may include screening for: Colorectal cancer. Prostate cancer. Skin cancer. Lung cancer. What should I know about heart disease, diabetes, and high blood pressure? Blood pressure and heart disease High blood pressure causes heart disease and increases the risk of stroke. This is more likely to develop in people who have high blood pressure readings or are overweight. Talk with your health care provider about your target blood pressure readings. Have your blood pressure checked: Every 3-5 years if you are 31-27 years of age. Every year if you are 60 years old or older. If you are between the ages of 99 and 68 and are a current or former smoker, ask your health care provider if you should have a one-time screening for abdominal aortic aneurysm (AAA). Diabetes Have regular diabetes screenings. This checks your fasting blood sugar level. Have the screening done: Once every three years after age 12 if you are at a normal weight and have a low risk for diabetes. More often and at a younger age if you are overweight or have a high risk for diabetes. What should I know about preventing infection? Hepatitis B If you have a higher risk for hepatitis B, you should be screened for this virus. Talk with your health care provider to find out if you are at risk for hepatitis B infection. Hepatitis C Blood testing is recommended for: Everyone born from [birth year] through 1966. Anyone with known risk factors for hepatitis C. Sexually transmitted infections (STIs) You should be screened each year for STIs, including gonorrhea and chlamydia, if: You are sexually active and are younger than 72 years of age. You are older than 72 years of age and your health care provider tells you that you are at risk for this type of infection. Your  sexual activity has changed since you were last screened, and you are at increased risk for chlamydia or gonorrhea. Ask your health care provider if you are at risk. Ask your health care provider about whether you are at high risk for HIV. Your health care provider may recommend a prescription medicine to help prevent HIV infection. If you choose to take medicine to prevent HIV, you should first get tested for HIV. You should then be tested every 3 months for as long as you are taking the medicine. Follow these instructions at home: Alcohol use Do not drink alcohol if your health care provider tells you not to drink. If you drink alcohol: Limit how much you have to 0-2 drinks a day. Know how much alcohol is in your drink. In the [place] one drink equals one 12 oz bottle of beer (355 mL), one 5 oz glass of wine (148 mL), or one 1 oz glass of hard liquor (44 mL). Lifestyle Do not use any products that contain nicotine or tobacco. These products include cigarettes, chewing tobacco, and vaping devices, such as e-cigarettes. If you need help quitting, ask your health care provider. Do not use street drugs. Do  not share needles. Ask your health care provider for help if you need support or information about quitting drugs. General instructions Schedule regular health, dental, and eye exams. Stay current with your vaccines. Tell your health care provider if: You often feel depressed. You have ever been abused or do not feel safe at home. Summary Adopting a healthy lifestyle and getting preventive care are important in promoting health and wellness. Follow your health care provider's instructions about healthy diet, exercising, and getting tested or screened for diseases. Follow your health care provider's instructions on monitoring your cholesterol and blood pressure. This information is not intended to replace advice given to you by your health care provider. Make sure you discuss any questions you  have with your health care provider. Document Revised: 10/19/2020 Document Reviewed: 10/19/2020 Elsevier Patient Education  [place].
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
## 2021-07-05 ENCOUNTER — Ambulatory Visit (INDEPENDENT_AMBULATORY_CARE_PROVIDER_SITE_OTHER): Payer: Medicare Other

## 2021-07-05 ENCOUNTER — Other Ambulatory Visit: Payer: Self-pay

## 2021-07-05 DIAGNOSIS — I7143 Infrarenal abdominal aortic aneurysm, without rupture: Secondary | ICD-10-CM
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
## 2021-07-26 ENCOUNTER — Other Ambulatory Visit: Payer: Self-pay

## 2021-07-26 ENCOUNTER — Encounter: Payer: Self-pay | Admitting: Cardiology

## 2021-07-26 ENCOUNTER — Ambulatory Visit (INDEPENDENT_AMBULATORY_CARE_PROVIDER_SITE_OTHER): Payer: Medicare Other | Admitting: Cardiology

## 2021-07-26 VITALS — BP 143/76 | HR 90 | Wt 210.1 lb

## 2021-07-26 DIAGNOSIS — I714 Abdominal aortic aneurysm, without rupture, unspecified: Secondary | ICD-10-CM | POA: Diagnosis not present

## 2021-07-26 DIAGNOSIS — I2510 Atherosclerotic heart disease of native coronary artery without angina pectoris: Secondary | ICD-10-CM

## 2021-07-26 DIAGNOSIS — E781 Pure hyperglyceridemia: Secondary | ICD-10-CM

## 2021-07-26 DIAGNOSIS — I10 Essential (primary) hypertension: Secondary | ICD-10-CM

## 2021-07-26 MED ORDER — ASPIRIN EC 81 MG PO TBEC: 81.0000 mg | DELAYED_RELEASE_TABLET | Freq: Every day | ORAL | 3 refills | Status: AC

## 2021-07-26 NOTE — Patient Instructions (Addendum)
Your physician has requested that you have an echocardiogram. Echocardiography is a painless test that uses sound waves to create images of your heart. It provides your doctor with information about the size and shape of your heart and how well your hearts chambers and valves are working. This procedure takes approximately one hour. There are no restrictions for this procedure. [facility]-1ST IMAGING DEPARTMENT    Follow-Up: At [facility], you and your health needs are our priority.  As part of our continuing mission to provide you with exceptional heart care, we have created designated Provider Care Teams.  These Care Teams include your primary Cardiologist (physician) and Advanced Practice Providers (APPs -  Physician Assistants and Nurse Practitioners) who all work together to provide you with the care you need, when you need it.  We recommend signing up for the patient portal called "MyChart".  Sign up information is provided on this After Visit Summary.  MyChart is used to connect with patients for Virtual Visits (Telemedicine).  Patients are able to view lab/test results, encounter notes, upcoming appointments, etc.  Non-urgent messages can be sent to your provider as well.   To learn more about what you can do with MyChart, go to [email or web address].    Your next appointment:   12 month(s)  The format for your next appointment:   In Person  Provider:   Kirk Ruths MD

## 2021-07-27 ENCOUNTER — Encounter: Payer: Self-pay | Admitting: *Deleted

## 2021-07-27 LAB — COMPREHENSIVE METABOLIC PANEL
AG Ratio: 1.7 (calc) (ref 1.0–2.5)
ALT: 35 U/L (ref 9–46)
AST: 42 U/L — ABNORMAL HIGH (ref 10–35)
Albumin: 4.4 g/dL (ref 3.6–5.1)
Alkaline phosphatase (APISO): 77 U/L (ref 35–144)
BUN: 14 mg/dL (ref 7–25)
CO2: 24 mmol/L (ref 20–32)
Calcium: 9.7 mg/dL (ref 8.6–10.3)
Chloride: 105 mmol/L (ref 98–110)
Creat: 1.07 mg/dL (ref 0.70–1.28)
Globulin: 2.6 g/dL (calc) (ref 1.9–3.7)
Glucose, Bld: 149 mg/dL — ABNORMAL HIGH (ref 65–99)
Potassium: 4.8 mmol/L (ref 3.5–5.3)
Sodium: 140 mmol/L (ref 135–146)
Total Bilirubin: 0.7 mg/dL (ref 0.2–1.2)
Total Protein: 7 g/dL (ref 6.1–8.1)

## 2021-07-27 LAB — LIPID PANEL
Cholesterol: 107 mg/dL (ref <200)
HDL: 26 mg/dL — ABNORMAL LOW (ref >=40)
LDL Cholesterol (Calc): 48 mg/dL (calc)
Non-HDL Cholesterol (Calc): 81 mg/dL (calc) (ref <130)
Total CHOL/HDL Ratio: 4.1 (calc) (ref <5.0)
Triglycerides: 278 mg/dL — ABNORMAL HIGH (ref <150)

## 2021-08-02 ENCOUNTER — Ambulatory Visit (HOSPITAL_BASED_OUTPATIENT_CLINIC_OR_DEPARTMENT_OTHER): Payer: Medicare Other

## 2021-08-05 ENCOUNTER — Other Ambulatory Visit (HOSPITAL_BASED_OUTPATIENT_CLINIC_OR_DEPARTMENT_OTHER): Payer: Medicare Other

## 2021-08-14 DIAGNOSIS — S7221XA Displaced subtrochanteric fracture of right femur, initial encounter for closed fracture: Secondary | ICD-10-CM | POA: Insufficient documentation

## 2021-08-14 LAB — HEMOGLOBIN A1C: Hemoglobin A1C: 7.8

## 2021-08-16 ENCOUNTER — Ambulatory Visit (HOSPITAL_BASED_OUTPATIENT_CLINIC_OR_DEPARTMENT_OTHER): Admission: RE | Admit: 2021-08-16 | Payer: Medicare Other | Source: Ambulatory Visit

## 2021-09-07 ENCOUNTER — Inpatient Hospital Stay: Payer: Medicare Other | Admitting: Family Medicine

## 2021-09-09 ENCOUNTER — Other Ambulatory Visit: Payer: Self-pay | Admitting: Osteopathic Medicine

## 2021-09-09 DIAGNOSIS — Z794 Long term (current) use of insulin: Secondary | ICD-10-CM

## 2021-10-18 ENCOUNTER — Encounter: Payer: Self-pay | Admitting: Family Medicine

## 2021-10-18 ENCOUNTER — Ambulatory Visit (INDEPENDENT_AMBULATORY_CARE_PROVIDER_SITE_OTHER): Payer: Medicare Other | Admitting: Family Medicine

## 2021-10-18 DIAGNOSIS — S7221XA Displaced subtrochanteric fracture of right femur, initial encounter for closed fracture: Secondary | ICD-10-CM | POA: Diagnosis not present

## 2021-10-18 DIAGNOSIS — Z794 Long term (current) use of insulin: Secondary | ICD-10-CM

## 2021-10-18 DIAGNOSIS — E1159 Type 2 diabetes mellitus with other circulatory complications: Secondary | ICD-10-CM | POA: Diagnosis not present

## 2021-10-18 DIAGNOSIS — I10 Essential (primary) hypertension: Secondary | ICD-10-CM | POA: Diagnosis not present

## 2021-10-18 DIAGNOSIS — M792 Neuralgia and neuritis, unspecified: Secondary | ICD-10-CM | POA: Insufficient documentation

## 2021-10-18 MED ORDER — NAPROXEN 500 MG PO TABS: 500.0000 mg | ORAL_TABLET | Freq: Two times a day (BID) | ORAL | 0 refills | Status: DC

## 2021-10-18 MED ORDER — GABAPENTIN 300 MG PO CAPS: ORAL_CAPSULE | ORAL | 3 refills | Status: DC

## 2021-10-18 MED ORDER — HYDROCODONE-ACETAMINOPHEN 5-325 MG PO TABS: 1.0000 | ORAL_TABLET | Freq: Four times a day (QID) | ORAL | 0 refills | Status: DC | PRN

## 2021-10-18 NOTE — Assessment & Plan Note (Signed)
He has pain at his distal neuropathic pain.  Discussed adding gabapentin and will start at 300 mg at bedtime with titration as tolerated over the next week. ?
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
## 2021-10-18 NOTE — Assessment & Plan Note (Signed)
Diabetes is fairly well controlled with current dose of insulin and metformin.  I think this will improve as he becomes more active. ?

## 2021-10-18 NOTE — Assessment & Plan Note (Signed)
Status post surgical repair.  Overall doing well.  He is back on naproxen for chronic joint pain.  We discussed that I would send 10 additional hydrocodone however he should continue to try to wean from this. ?

## 2021-10-18 NOTE — Patient Instructions (Addendum)
Nice to see you today! ?Try gabapentin for neuropathy pain.  ?Let's catch up again in in 3-4 months.  ?

## 2021-10-18 NOTE — Assessment & Plan Note (Signed)
Blood pressure remains well controlled with amlodipine at current strength.  Recommend continuation at this time. ?

## 2021-11-08 ENCOUNTER — Other Ambulatory Visit (HOSPITAL_COMMUNITY): Payer: Medicare Other

## 2021-11-09 ENCOUNTER — Other Ambulatory Visit (HOSPITAL_COMMUNITY): Payer: Medicare Other

## 2021-11-12 ENCOUNTER — Ambulatory Visit (HOSPITAL_BASED_OUTPATIENT_CLINIC_OR_DEPARTMENT_OTHER)
Admission: RE | Admit: 2021-11-12 | Discharge: 2021-11-12 | Disposition: A | Discharge status: Home / Self Care | Payer: Medicare Other | Source: Ambulatory Visit | Attending: Cardiology | Admitting: Cardiology

## 2021-11-12 DIAGNOSIS — I2510 Atherosclerotic heart disease of native coronary artery without angina pectoris: Secondary | ICD-10-CM | POA: Diagnosis not present

## 2021-11-12 LAB — ECHOCARDIOGRAM COMPLETE
AR max vel: 1.95 cm2
AV Area VTI: 2.64 cm2
AV Area mean vel: 1.92 cm2
AV Mean grad: 4 mmHg
AV Peak grad: 7.6 mmHg
Ao pk vel: 1.38 m/s
Area-P 1/2: 4.89 cm2
S' Lateral: 3.2 cm

## 2021-11-16 ENCOUNTER — Encounter: Payer: Self-pay | Admitting: *Deleted

## 2021-11-22 ENCOUNTER — Other Ambulatory Visit: Payer: Self-pay

## 2021-11-22 ENCOUNTER — Other Ambulatory Visit: Payer: Self-pay | Admitting: Family Medicine

## 2021-11-22 NOTE — Progress Notes (Signed)
The fill previously was supposed to be a temporary fill.  He'll need to follow up if we are going to writing this chronically.

## 2021-11-23 ENCOUNTER — Other Ambulatory Visit: Payer: Self-pay | Admitting: Family Medicine

## 2021-11-23 NOTE — Telephone Encounter (Signed)
Patient stated he [place] and WILL NOT WAIT!!! Patient stated HE WILL NOT MAKE AN APPT & WILL MAKE OTHER ARRANGES WITH ANOTHER PROVIDER!

## 2021-11-23 NOTE — Telephone Encounter (Signed)
Refill of hydrocodone is not appropriate.  Per previous discussion he was supposed to be weaning from this.  I would be willing to send a few tramadol to use in addition to tylenol and naprosyn.  Additional threats and demands medication will result in dismissal from the practice.    CM

## 2021-11-23 NOTE — Telephone Encounter (Signed)
Pls contact pt and advise that a visit concerning pain medication refill is required. The medication was to be used short term per Dr. Zigmund Daniel. Thanks

## 2021-11-24 NOTE — Telephone Encounter (Signed)
V.Mail left for patient regarding last message from Dr. Zigmund Daniel.

## 2021-12-10 DIAGNOSIS — M810 Age-related osteoporosis without current pathological fracture: Secondary | ICD-10-CM | POA: Insufficient documentation

## 2021-12-17 ENCOUNTER — Telehealth: Payer: Self-pay

## 2021-12-17 MED ORDER — PREDNISONE 20 MG PO TABS
ORAL_TABLET | ORAL | 0 refills | Status: DC
Start: 2021-12-17 — End: 2022-02-18

## 2021-12-17 MED ORDER — PREDNISONE 20 MG PO TABS: ORAL_TABLET | ORAL | 0 refills | Status: DC

## 2021-12-17 NOTE — Telephone Encounter (Signed)
Pt stopped by the office today due to continued rash that began after taking meloxicam and clindamycin.   Pt [place] 5 [place] [facility] [place] [facility] and is 75% better.   He would like additional medication for the rash.   Routed to [facility] for consideration.  Pharmacy: [facility]

## 2021-12-17 NOTE — Addendum Note (Signed)
Addended by: Donella Stade on: 12/17/2021 04:07 PM   Modules accepted: Orders

## 2021-12-17 NOTE — Addendum Note (Signed)
Addended by: Donella Stade on: 12/17/2021 04:08 PM   Modules accepted: Orders

## 2021-12-17 NOTE — Telephone Encounter (Signed)
Rash 75 percent better on prednisone for 5 days. Sent a little longer taper. Continue benadryl for itching.

## 2022-01-11 ENCOUNTER — Other Ambulatory Visit: Payer: Self-pay | Admitting: Family Medicine

## 2022-01-11 DIAGNOSIS — Z794 Long term (current) use of insulin: Secondary | ICD-10-CM

## 2022-02-18 ENCOUNTER — Encounter: Payer: Self-pay | Admitting: Family Medicine

## 2022-02-18 ENCOUNTER — Ambulatory Visit (INDEPENDENT_AMBULATORY_CARE_PROVIDER_SITE_OTHER): Payer: Medicare Other | Admitting: Family Medicine

## 2022-02-18 VITALS — BP 119/65 | HR 60 | Ht 69.0 in | Wt 203.0 lb

## 2022-02-18 DIAGNOSIS — M792 Neuralgia and neuritis, unspecified: Secondary | ICD-10-CM | POA: Diagnosis not present

## 2022-02-18 DIAGNOSIS — I10 Essential (primary) hypertension: Secondary | ICD-10-CM

## 2022-02-18 DIAGNOSIS — Z794 Long term (current) use of insulin: Secondary | ICD-10-CM

## 2022-02-18 DIAGNOSIS — S7221XA Displaced subtrochanteric fracture of right femur, initial encounter for closed fracture: Secondary | ICD-10-CM

## 2022-02-18 DIAGNOSIS — E1159 Type 2 diabetes mellitus with other circulatory complications: Secondary | ICD-10-CM

## 2022-02-18 DIAGNOSIS — J40 Bronchitis, not specified as acute or chronic: Secondary | ICD-10-CM

## 2022-02-18 DIAGNOSIS — J329 Chronic sinusitis, unspecified: Secondary | ICD-10-CM

## 2022-02-18 LAB — POCT GLYCOSYLATED HEMOGLOBIN (HGB A1C): HbA1c, POC (controlled diabetic range): 7 % (ref 0.0–7.0)

## 2022-02-18 LAB — POCT UA - MICROALBUMIN
Creatinine, POC: 300 mg/dL
Microalbumin Ur, POC: 150 mg/L

## 2022-02-18 MED ORDER — ALBUTEROL SULFATE HFA 108 (90 BASE) MCG/ACT IN AERS: 2.0000 | INHALATION_SPRAY | Freq: Four times a day (QID) | RESPIRATORY_TRACT | 0 refills | Status: DC | PRN

## 2022-02-18 MED ORDER — DOXYCYCLINE HYCLATE 100 MG PO TABS: 100.0000 mg | ORAL_TABLET | Freq: Two times a day (BID) | ORAL | 0 refills | Status: DC

## 2022-02-18 MED ORDER — TRAMADOL HCL 50 MG PO TABS: 50.0000 mg | ORAL_TABLET | Freq: Four times a day (QID) | ORAL | 0 refills | Status: AC | PRN

## 2022-02-18 MED ORDER — PREDNISONE 50 MG PO TABS: ORAL_TABLET | ORAL | 0 refills | Status: DC

## 2022-02-18 NOTE — Patient Instructions (Addendum)
You can use tramadol as needed for pain in addition to tylenol and naproxen.   Start doxycycline and prednisone for bronchitis.  Use albuterol as needed.  Let me know if not improving.   Continue current medications for diabetes

## 2022-02-20 DIAGNOSIS — J329 Chronic sinusitis, unspecified: Secondary | ICD-10-CM | POA: Insufficient documentation

## 2022-02-20 NOTE — Assessment & Plan Note (Signed)
Lab Results  Component Value Date   HGBA1C 7.0 02/18/2022  Blood sugar remains fairly well controlled.  Continue insulin and metformin at current strength.

## 2022-02-20 NOTE — Assessment & Plan Note (Signed)
Blood pressure remains well controlled.  Recommend continuation of current medication at current strength.

## 2022-02-20 NOTE — Assessment & Plan Note (Signed)
Gabapentin have been helpful for this.

## 2022-02-20 NOTE — Assessment & Plan Note (Signed)
Adding course of prednisone 50 mg daily with doxycycline x10 days.  Albuterol inhaler added as well.  Instructed to contact clinic if symptoms or not improving or having worsening symptoms.

## 2022-02-20 NOTE — Assessment & Plan Note (Signed)
Continues to have pain related to this.  He may continue naproxen as needed.  Adding tramadol to use as needed sparingly for severe pain.
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
## 2022-03-05 ENCOUNTER — Other Ambulatory Visit: Payer: Self-pay | Admitting: Family Medicine

## 2022-03-08 ENCOUNTER — Telehealth: Payer: Self-pay

## 2022-03-08 ENCOUNTER — Other Ambulatory Visit: Payer: Self-pay | Admitting: Family Medicine

## 2022-03-08 MED ORDER — PREDNISONE 50 MG PO TABS: ORAL_TABLET | ORAL | 0 refills | Status: DC

## 2022-03-08 MED ORDER — DOXYCYCLINE HYCLATE 100 MG PO TABS: 100.0000 mg | ORAL_TABLET | Freq: Two times a day (BID) | ORAL | 0 refills | Status: DC

## 2022-03-08 NOTE — Telephone Encounter (Signed)
Pt lvm stating symptoms have not resolved but getting better.  Should he have another visit or another round of medication.   Returned pt's call. LVM advising Dr. Zigmund Daniel sent medication to the pharmacy. Callback information provided for questions.

## 2022-04-04 ENCOUNTER — Other Ambulatory Visit: Payer: Self-pay | Admitting: Family Medicine

## 2022-04-04 DIAGNOSIS — I2510 Atherosclerotic heart disease of native coronary artery without angina pectoris: Secondary | ICD-10-CM

## 2022-04-04 DIAGNOSIS — Z794 Long term (current) use of insulin: Secondary | ICD-10-CM

## 2022-04-04 DIAGNOSIS — I10 Essential (primary) hypertension: Secondary | ICD-10-CM

## 2022-04-15 ENCOUNTER — Other Ambulatory Visit: Payer: Self-pay | Admitting: Family Medicine

## 2022-04-15 DIAGNOSIS — M1A00X0 Idiopathic chronic gout, unspecified site, without tophus (tophi): Secondary | ICD-10-CM

## 2022-05-08 IMAGING — DX DG SHOULDER 2+V*R*
3 series · 3 of 3 positions shown · non-contrast
Comparison: None.

CLINICAL DATA: Impingement right shoulder.  Lifting injury.

EXAM:
RIGHT SHOULDER - 2+ VIEW

[shoulder grashey]
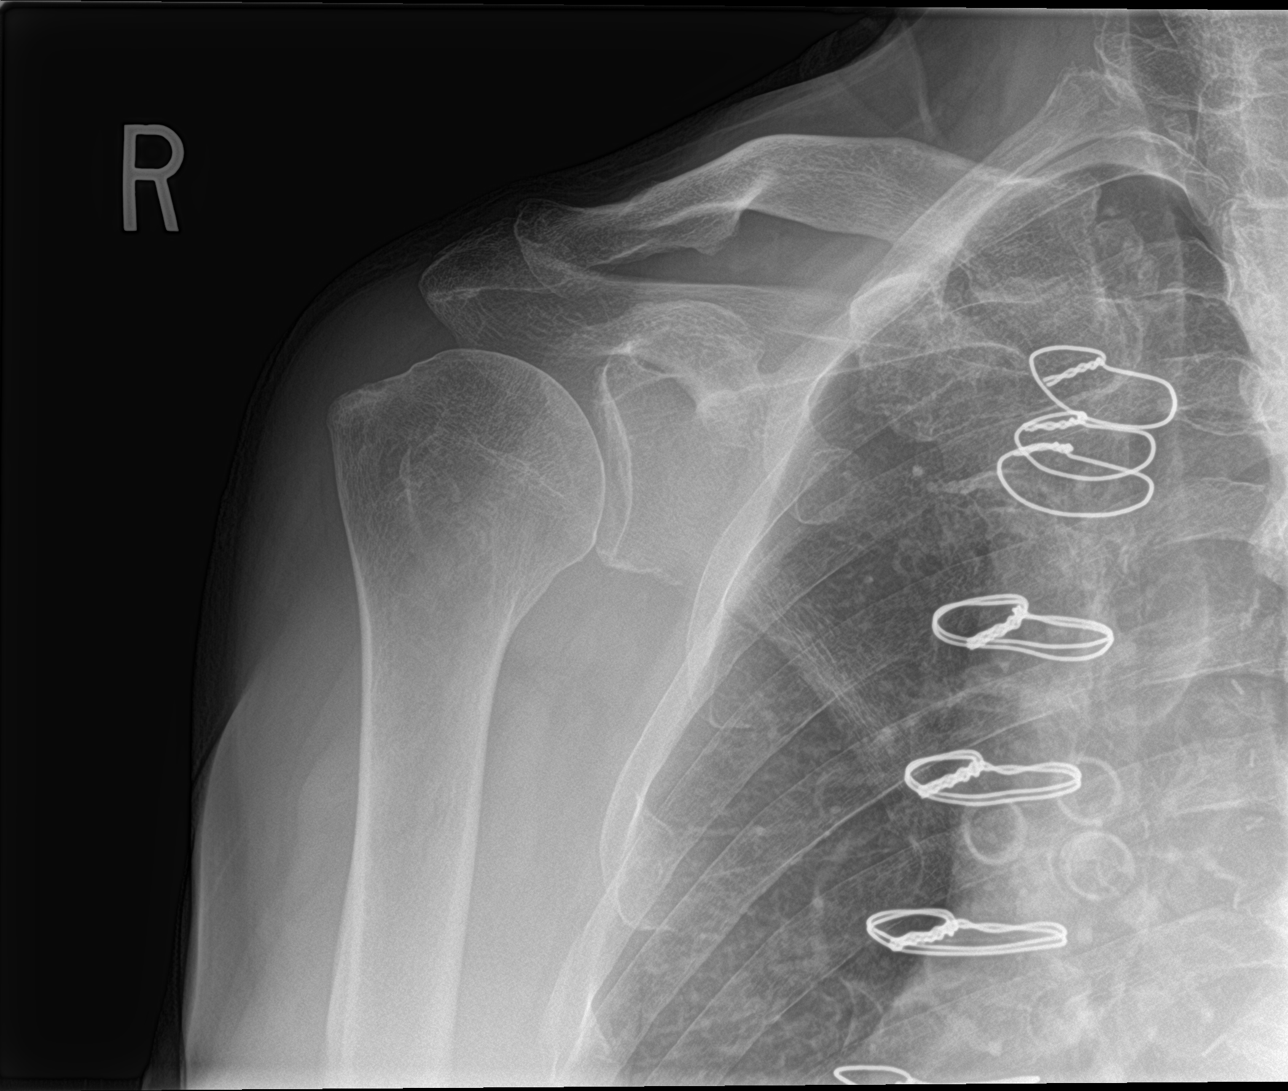

[shoulder y view]
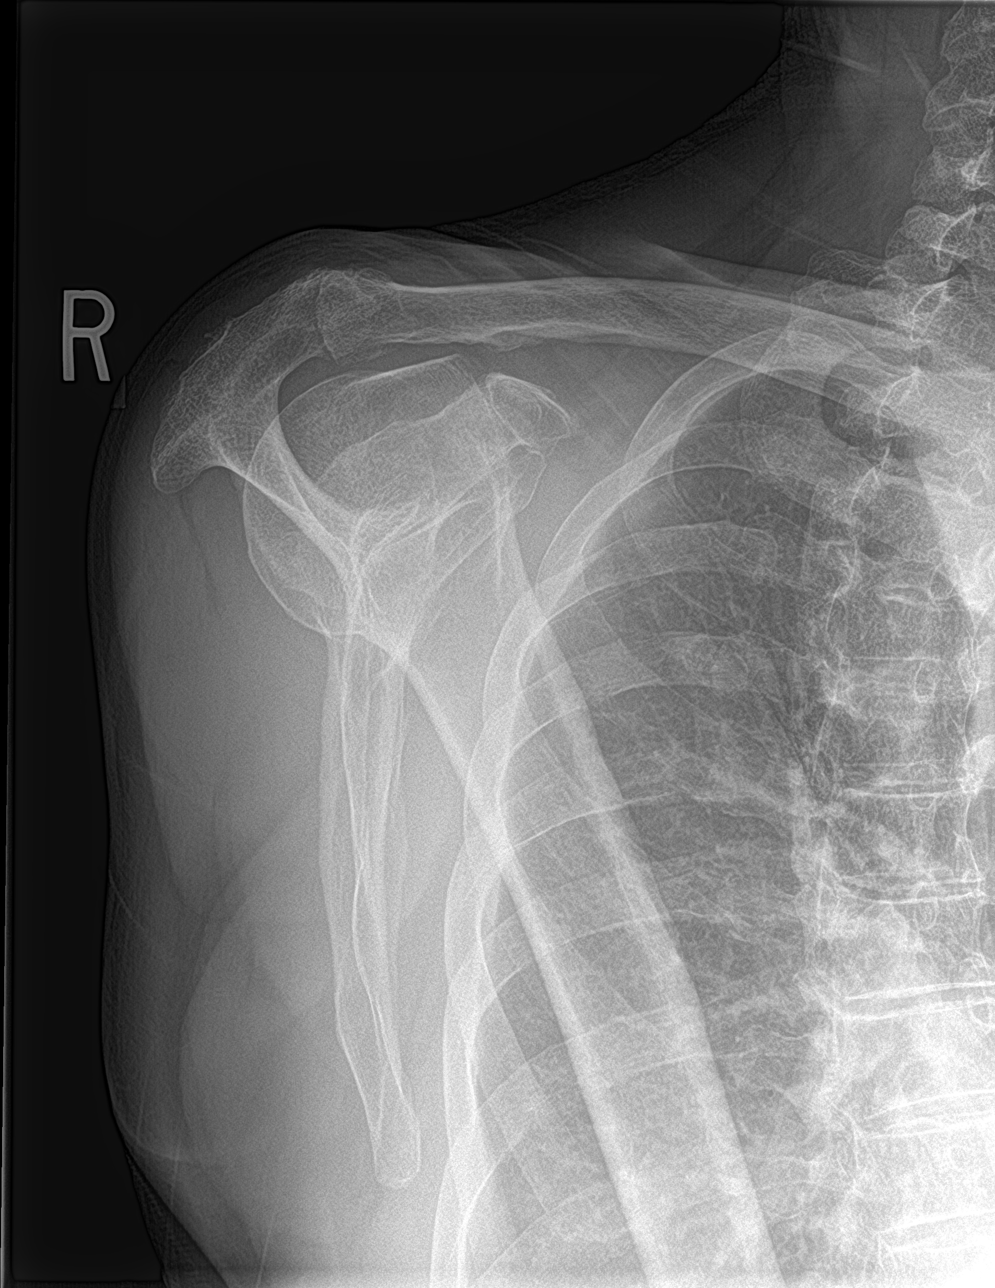

[shoulder axillary]
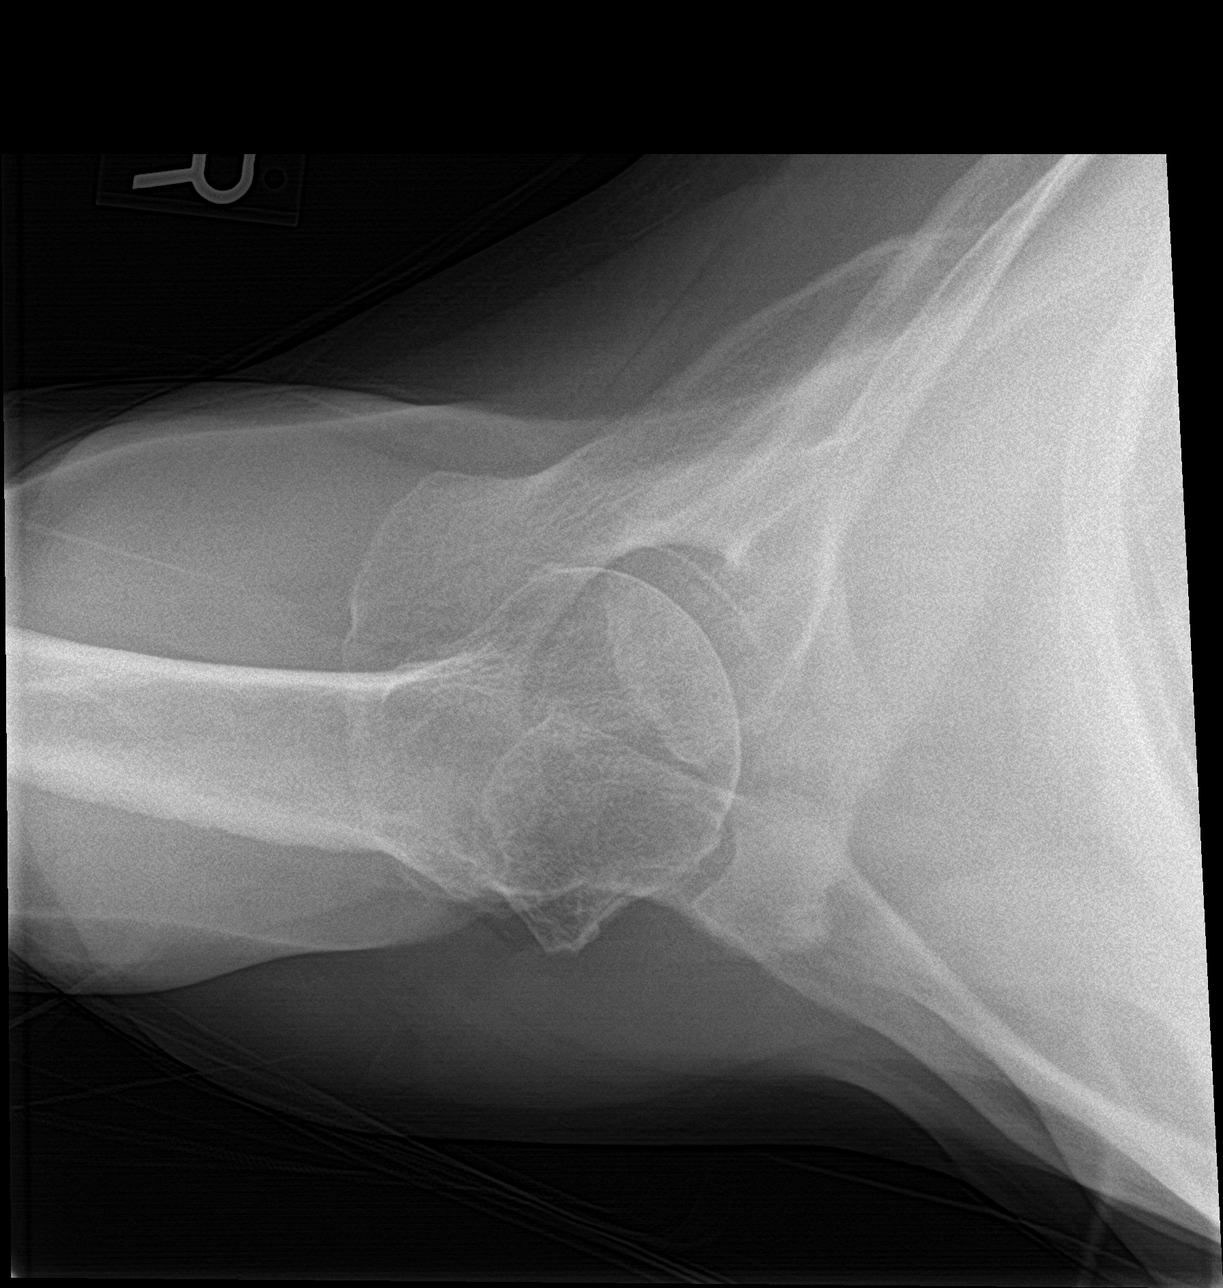

[3 of 3 positions shown; findings below may reference images not displayed]

FINDINGS: Degenerative changes in the AC joint with joint space narrowing and
spurring. Glenohumeral joint is maintained. No acute bony
abnormality. Specifically, no fracture, subluxation, or dislocation.
Soft tissues are intact.
IMPRESSION: Degenerative changes in the right AC joint. No acute bony
abnormality.

## 2022-05-12 ENCOUNTER — Other Ambulatory Visit: Payer: Self-pay | Admitting: Family Medicine

## 2022-05-12 DIAGNOSIS — Z794 Long term (current) use of insulin: Secondary | ICD-10-CM

## 2022-05-23 ENCOUNTER — Other Ambulatory Visit: Payer: Self-pay

## 2022-05-23 ENCOUNTER — Encounter: Payer: Self-pay | Admitting: Emergency Medicine

## 2022-05-23 ENCOUNTER — Ambulatory Visit
Admission: EM | Admit: 2022-05-23 | Discharge: 2022-05-23 | Disposition: A | Discharge status: Home / Self Care | Payer: Medicare Other | Attending: Family Medicine | Admitting: Family Medicine

## 2022-05-23 ENCOUNTER — Telehealth: Payer: Self-pay | Admitting: Emergency Medicine

## 2022-05-23 DIAGNOSIS — S39012A Strain of muscle, fascia and tendon of lower back, initial encounter: Secondary | ICD-10-CM | POA: Diagnosis not present

## 2022-05-23 DIAGNOSIS — M62830 Muscle spasm of back: Secondary | ICD-10-CM

## 2022-05-23 MED ORDER — OXYCODONE-ACETAMINOPHEN 5-325 MG PO TABS: 1.0000 | ORAL_TABLET | Freq: Three times a day (TID) | ORAL | 0 refills | Status: AC | PRN

## 2022-05-23 MED ORDER — METHOCARBAMOL 500 MG PO TABS: 500.0000 mg | ORAL_TABLET | Freq: Three times a day (TID) | ORAL | 0 refills | Status: DC | PRN

## 2022-05-23 MED ORDER — PREDNISONE 10 MG (21) PO TBPK: ORAL_TABLET | Freq: Every day | ORAL | 0 refills | Status: DC

## 2022-05-23 NOTE — ED Triage Notes (Signed)
Lower back pain x 3 days, did yard work all day Friday

## 2022-05-23 NOTE — Discharge Instructions (Addendum)
Advised patient to take medication as directed with food to completion.  Advised may take Robaxin daily or as needed for accompanying muscle spasms of lower back.  Advised patient may take Percocet for breakthrough back pain.  Encouraged patient to avoid offending activities of lower back for the next 7 to 10 days.  Encouraged patient to increase daily water intake to 64 ounces per day while taking these medications.  Advised if symptoms worsen and/or unresolved please follow-up with PCP or here for further evaluation.

## 2022-05-23 NOTE — ED Notes (Signed)
Patient and wife returned at 1:15, wife said patient passed out. The Pharmacist said it was from diabetes, patient said it was from leg pain. [name] said she could not register him again I spoke to them and advised them that they had to go to The emergency room because of the loss of consciousness. I offered to take him out in a wheelchair and he refused. They left going to the hospital
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
## 2022-05-23 NOTE — ED Notes (Signed)
Patient called back and wanted meds, [facility] decided to cancel the prescriptions as he was uncomfortable giving him Percocet and Robaxin after learning the patient had a LOC and did not go to the ED as was told the second time he came in

## 2022-05-26 ENCOUNTER — Emergency Department (HOSPITAL_COMMUNITY): Payer: Medicare Other

## 2022-05-26 ENCOUNTER — Emergency Department (HOSPITAL_COMMUNITY)
Admission: EM | Admit: 2022-05-26 | Discharge: 2022-05-26 | Discharge status: Against Medical Advice | Payer: Medicare Other | Attending: Emergency Medicine | Admitting: Emergency Medicine

## 2022-05-26 ENCOUNTER — Other Ambulatory Visit: Payer: Self-pay | Admitting: Family Medicine

## 2022-05-26 DIAGNOSIS — Z5321 Procedure and treatment not carried out due to patient leaving prior to being seen by health care provider: Secondary | ICD-10-CM | POA: Diagnosis not present

## 2022-05-26 DIAGNOSIS — M545 Low back pain, unspecified: Secondary | ICD-10-CM | POA: Insufficient documentation

## 2022-05-26 MED ORDER — METHOCARBAMOL 500 MG PO TABS
500.0000 mg | ORAL_TABLET | Freq: Once | ORAL | Status: AC
  Administered 2022-05-26: 500 mg via ORAL
  Filled 2022-05-26: qty 1

## 2022-05-26 NOTE — ED Notes (Signed)
Patient states that they are leaving due to wait times.

## 2022-05-26 NOTE — ED Triage Notes (Signed)
Patient here for evaluation of lower back pain that started Sunday after doing yard work on Saturday. Denies fall, denies legs numbness or weakness, denies groin numbness. Patient is alert, oriented, and in no apparent distress at this time.

## 2022-05-26 NOTE — ED Provider Triage Note (Signed)
Emergency Medicine Provider Triage Evaluation Note  [name] , a 70 y.o. male  was evaluated in triage.  Patient complains of low back pain that started Sunday.  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  He states on Saturday he was gingerly pruning, did not fall, or have any other injury.  He was seen at an urgent care initially prescribed medicines for conservative management.  He had a worsening pain for which he returned to the urgent care.  He was recommended to come to the ED.  Previous prescriptions were canceled.  He presents today to the ED for further ration.  Has history of abdominal aortic aneurysm which she states was recently checked in the spring and measures 4.8 cm.  Denies paresthesias to either lower extremity.  Denies abdominal pain.  Review of Systems  Positive: As above Negative: As above  Physical Exam  BP (!) 140/79 (BP Location: Right Arm)   Pulse 90   Temp 98.2 F (36.8 C) (Oral)   Resp 18   SpO2 98%  Gen:   Awake, no distress   Resp:  Normal effort  MSK:   Moves extremities without difficulty  Other:    Medical Decision Making  Medically screening exam initiated at 2:15 PM.  Appropriate orders placed.  [name] was informed that the remainder of the evaluation will be completed by another provider, this initial triage assessment does not replace that evaluation, and the importance of remaining in the ED until their evaluation is complete.     Evlyn Courier, PA-C 05/26/22 1417

## 2022-05-27 ENCOUNTER — Telehealth: Payer: Self-pay

## 2022-05-27 NOTE — Telephone Encounter (Signed)
Can offer appt for next week w/ me or offer if anyone else has availability today.

## 2022-05-27 NOTE — Telephone Encounter (Signed)
Pt lvm stating he was unable to walk or stand upright.   He was seen at Urgent Care and prescriptions sent to the pharmacy. Pt states the UC provider cancelled the rx's before pick-up so he is without meds.   Pt was seen at [facility] ER with Imaging taken of his back. He admittedly left AMA.   Per Dr. Zigmund Daniel, patient needs to be seen at the ER.  Please call patient and advise. Thanks

## 2022-05-30 ENCOUNTER — Telehealth: Payer: Self-pay | Admitting: General Practice

## 2022-05-30 ENCOUNTER — Other Ambulatory Visit: Payer: Self-pay | Admitting: *Deleted

## 2022-05-30 ENCOUNTER — Ambulatory Visit: Payer: Medicare Other | Admitting: Family Medicine

## 2022-05-30 ENCOUNTER — Encounter: Payer: Self-pay | Admitting: Family Medicine

## 2022-05-30 VITALS — BP 150/68 | HR 97 | Ht 69.0 in | Wt 220.0 lb

## 2022-05-30 DIAGNOSIS — M47816 Spondylosis without myelopathy or radiculopathy, lumbar region: Secondary | ICD-10-CM

## 2022-05-30 DIAGNOSIS — I714 Abdominal aortic aneurysm, without rupture, unspecified: Secondary | ICD-10-CM

## 2022-05-30 MED ORDER — PREDNISONE 50 MG PO TABS: ORAL_TABLET | ORAL | 0 refills | Status: DC

## 2022-05-30 MED ORDER — HYDROCODONE-ACETAMINOPHEN 5-325 MG PO TABS: 1.0000 | ORAL_TABLET | Freq: Four times a day (QID) | ORAL | 0 refills | Status: DC | PRN

## 2022-05-30 MED ORDER — CYCLOBENZAPRINE HCL 10 MG PO TABS: 10.0000 mg | ORAL_TABLET | Freq: Three times a day (TID) | ORAL | 0 refills | Status: DC | PRN

## 2022-05-30 NOTE — Patient Instructions (Signed)
Start prednisone daily.  Flexeril and hydrocodone as needed.  Incorporate gentle stretching once pain is improving.  Let me know if not improving.
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
## 2022-05-30 NOTE — Telephone Encounter (Signed)
Transition Care Management Follow-up Telephone Call Date of discharge and from where: 05/26/22 from [facility] How have you been since you were released from the hospital? Patient is still in a lot pain and has an appt with pcp today 05/30/22. Any questions or concerns? No  Items Reviewed: Did the pt receive and understand the discharge instructions provided? Yes  Medications obtained and verified? Yes  Other? No  Any new allergies since your discharge? No  Dietary orders reviewed? Yes Do you have support at home? Yes   Home Care and Equipment/Supplies: Were home health services ordered? no  Functional Questionnaire: (I = Independent and D = Dependent) ADLs: I  Bathing/Dressing- I  Meal Prep- I  Eating- I  Maintaining continence- I  Transferring/Ambulation- I  Managing Meds- I  Follow up appointments reviewed:  PCP Hospital f/u appt confirmed? Yes  Scheduled to see Dr. Zigmund Daniel on 05/30/22 @ 1610. [facility] f/u appt confirmed? No   Are transportation arrangements needed? No  If their condition worsens, is the pt aware to call PCP or go to the Emergency Dept.? Yes Was the patient provided with contact information for the PCP's office or ED? Yes Was to pt encouraged to call back with questions or concerns? Yes

## 2022-05-30 NOTE — Assessment & Plan Note (Signed)
Adding course of prednisone as well as muscle relaxer.  Norco as needed.  Encouraged to incorporate some gentle stretching once pain is improving.

## 2022-06-07 ENCOUNTER — Telehealth: Payer: Self-pay

## 2022-06-07 NOTE — Telephone Encounter (Signed)
Patient was recently seen by Dr. Zigmund Daniel for sore back and inability to walk upright. Pt received prednisone and flexeril. Norco as needed.   Pt lvm stating he is out of prednisone and his back is still sore.  Requesting additional medication or appt. Please advise.

## 2022-06-08 ENCOUNTER — Other Ambulatory Visit: Payer: Self-pay | Admitting: Family Medicine

## 2022-06-08 NOTE — Telephone Encounter (Signed)
Spoke to patient concerning starting Physical Therapy. Pt states that he doesn't feel he can participate in physical therapy with the amount of pain he is currently experiencing. States he was in PT this spring and continues to attempt the exercises. He is currently unable to get out of bed or stand erectly. [name] would like for the doctors to "figure it out" and let him know next steps. Per him, he is not a drug seeker and doesn't care about the prednisone.    Please advise.

## 2022-06-08 NOTE — Telephone Encounter (Signed)
Per discussion I would need to see him before I can figure it out.  He can either wait to see me or he can see one of the other sports docs.

## 2022-06-08 NOTE — Telephone Encounter (Signed)
Spoke to patient concerning starting Physical Therapy. Pt states that he doesn't feel he can participate in physical therapy with the amount of pain he is currently experiencing. States he was in PT this spring and continues to attempt the exercises. He is currently unable to get out of bed or stand erectly. [name] would like for the doctors to "figure it out" and let him know next steps. Per him, he is not a drug seeker and doesn't care about the prednisone.   Please advise.

## 2022-06-09 NOTE — Telephone Encounter (Signed)
Please contact the patient and advise that he can schedule with Dr. Darene Lamer concerning his pain and back issue. Or he can schedule with another provider for possible pain management. If not, Urgent Care is advised.   Thanks

## 2022-06-10 ENCOUNTER — Ambulatory Visit (INDEPENDENT_AMBULATORY_CARE_PROVIDER_SITE_OTHER): Payer: Medicare Other | Admitting: Family Medicine

## 2022-06-10 ENCOUNTER — Encounter: Payer: Self-pay | Admitting: Family Medicine

## 2022-06-10 VITALS — BP 118/66 | HR 107 | Temp 98.3°F | Ht 69.0 in | Wt 208.1 lb

## 2022-06-10 DIAGNOSIS — M545 Low back pain, unspecified: Secondary | ICD-10-CM

## 2022-06-10 MED ORDER — CYCLOBENZAPRINE HCL 10 MG PO TABS: 10.0000 mg | ORAL_TABLET | Freq: Three times a day (TID) | ORAL | 0 refills | Status: DC | PRN

## 2022-06-10 MED ORDER — TRAMADOL HCL 50 MG PO TABS: 50.0000 mg | ORAL_TABLET | Freq: Every evening | ORAL | 0 refills | Status: DC | PRN

## 2022-06-10 NOTE — Patient Instructions (Addendum)
Call today to get on Dr. Darene Lamer schedule. I placed an order for ortho today.  If you can see Dr. Darene Lamer before the ortho appointment that is good.  Heating pad for 20 minutes at time may help too.

## 2022-06-10 NOTE — Progress Notes (Signed)
Established Patient Office Visit  Subjective   Patient ID: [name], male    DOB: 1951/03/21  Age: 71 y.o. MRN: [id number]  Chief Complaint  Patient presents with   Back Pain    Patient in office c/o back pain x 4 weeks - after doing yard work picking up leaves. Seen at urgent care [facility] was told had pulled muscles across both hips to spinal cord/ inflammed. Never received the prescriptions written for him at urgent care. Was told scripts had been rescended by provider. Went to [facility] ( [address]) was there for 6 hours- x-rays done. Left ER without being seen . Then seen  by Dr. Zigmund Daniel. Prescriptions written by Dr. Zigmund Daniel has not helped pain. Femur replaced March 20    HPI  Presents today for an acute visit with complaint of doing yard work 4 weeks ago, pain started on Saturday morning in low back, progressed to severe pain in low back pain by Sunday morning. Right leg numbness and tingling. Reports his sensation is intact.  Symptoms have been present for 4 weeks.  Pain severity: Sitting 1/10, at first 10/10. Pain worse now going from sitting to standing.  Associated symptoms include: numbness and tingling in right leg, Denies saddle paresthesia, no bowel or bladder involvement. No fever.  Treatments tried include : hydrocodone, prednisone, muscle relaxer. Takes Naproxen BID for arthritis.  Treatment effective : ROM has improved, currently pain is not constant, pain worse now going from sitting to standing. Pain pills have helped him sleep. He is out of the pain pills and muscle relaxer's.   PDMP review: 12/11 oxycodone-acetaminophen # 21 (reports this prescription was rescinded).  12/18: hydrocodone-acetaminophen # 20.  12/14: lumbar spine x-ray: no fracture, moderate lower lumbar facet arthrosis.   Review of Systems  Musculoskeletal:  Positive for back pain.       Using a cane to walk. Reports pain has improved in the past 4 weeks, not resolved. ROM has improved.        Objective:     BP 118/66   Pulse (!) 107   Temp 98.3 F (36.8 C)   Ht '5\' 9"'$  (1.753 m)   Wt 208 lb 1 oz (94.4 kg)   SpO2 97%   BMI 30.73 kg/m    Physical Exam Vitals and nursing note reviewed.  Constitutional:      General: He is not in acute distress.    Appearance: Normal appearance.  Pulmonary:     Effort: Pulmonary effort is normal.  Musculoskeletal:     Comments: Decreased ROM in upper body (has improved per patient), unable to bend to touch toes, able to twist left and right without severe pain, not picking up right leg while walking, able to do high knee raise on right leg while standing without causing pain. No bony tenderness in cervical/thoracic/lumbar spine. No bony tenderness in pelvis. No bony tenderness in right or left hips. Unable to reproduce pain with palpation. Negative straight leg raise on right and left. Using cane. Circulation and sensation intact.   Skin:    General: Skin is warm and dry.  Neurological:     Mental Status: He is alert.      No results found for any visits on 06/10/22.    The ASCVD Risk score (Arnett DK, et al., 2019) failed to calculate for the following reasons:   The valid total cholesterol range is 130 to 320 mg/dL    Assessment & Plan:   Problem  List Items Addressed This Visit     Acute midline low back pain without sciatica - Primary   Relevant Medications   cyclobenzaprine (FLEXERIL) 10 MG tablet   traMADol (ULTRAM) 50 MG tablet   Other Relevant Orders   Ambulatory referral to Orthopedic Surgery  Acute low back pain. Decreased ROM in upper body (has improved per patient), able to twist left and right without severe pain, not picking up right leg with walking, able to do high knee raise without pain. No bony tenderness in cervical/thoracic/lumbar spine. No bony tenderness in pelvis/hips. Negative straight leg raise on right/left. Circulation and sensation intact.  Using cane.  Negative for lumbar fracture on 12/14.   Reports ROM and pain has improved in past 4 weeks, worse with standing from seated position, pain not constant.  Reports Flexeril and pain pills helped him to rest.  Discussed course of back pain and that it is encouraging that he has seen some improvement.  Flexeril refilled. Tramadol # 10 at bedtime. Continue Naproxen, avoid ibuprofen while taking naproxen.  Heating pad as instructed.  Recommend follow-up with Dr. Darene Lamer for assessment and treatment.  Urgent ortho referral placed. Patient will see which appointment he can get first. This patient may need MRI if symptoms continue.  Agrees with plan of care discussed today. Questions answered.    No follow-ups on file.    Chalmers Guest, FNP

## 2022-06-27 ENCOUNTER — Inpatient Hospital Stay (HOSPITAL_COMMUNITY): Admission: RE | Admit: 2022-06-27 | Payer: Medicare Other | Source: Ambulatory Visit

## 2022-07-04 ENCOUNTER — Ambulatory Visit (INDEPENDENT_AMBULATORY_CARE_PROVIDER_SITE_OTHER): Payer: Medicare Other | Admitting: Family Medicine

## 2022-07-04 DIAGNOSIS — Z0000 Encounter for general adult medical examination without abnormal findings: Secondary | ICD-10-CM | POA: Diagnosis not present
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
## 2022-07-04 NOTE — Patient Instructions (Signed)
[place] Maintenance Summary and Written Plan of Care  [name] ,  Thank you for allowing me to perform your Medicare Annual Wellness Visit and for your ongoing commitment to your health.   Health Maintenance & Immunization History Health Maintenance  Topic Date Due   DTaP/Tdap/Td (1 - Tdap) Never done   Diabetic kidney evaluation - eGFR measurement  07/26/2022   OPHTHALMOLOGY EXAM  07/04/2022 (Originally 06/29/2022)   FOOT EXAM  07/14/2022 (Originally 04/20/2022)   COVID-19 Vaccine (6 - 2023-24 season) 07/20/2022 (Originally 05/10/2022)   Lung Cancer Screening  07/05/2023 (Originally 08/29/2004)   COLONOSCOPY (Pts 45-48yr Insurance coverage will need to be confirmed)  07/05/2023 (Originally 08/27/1994)   HEMOGLOBIN A1C  08/19/2022   Diabetic kidney evaluation - Urine ACR  02/19/2023   Medicare Annual Wellness (AWV)  07/05/2023   INFLUENZA VACCINE  Completed   Hepatitis C Screening  Completed   HPV VACCINES  Aged Out   Pneumonia Vaccine 72 Years old  Discontinued   Zoster Vaccines- Shingrix  Discontinued   Immunization History  Administered Date(s) Administered   Fluad Quad(high Dose 65+) 02/21/2019, 04/06/2021, 03/15/2022   Influenza Split 02/28/2012   Influenza, High Dose Seasonal PF 03/28/2016, 04/10/2017, 03/29/2018   Influenza,inj,Quad PF,6+ Mos 04/07/2014, 04/23/2015   Influenza-Unspecified 03/28/2016, 03/29/2018, 05/02/2020   PFIZER(Purple Top)SARS-COV-2 Vaccination 08/30/2019, 09/20/2019, 05/04/2020, 04/06/2021, 03/15/2022    These are the patient goals that we discussed:  Goals Addressed               This Visit's Progress     Patient Stated (pt-stated)        Patient stated that he would like to get his back pain resolved.          This is a list of Health Maintenance Items that are overdue or due now: Health Maintenance Due  Topic Date Due   DTaP/Tdap/Td (1 - Tdap) Never done   Diabetic kidney evaluation - eGFR  measurement  07/26/2022     Orders/Referrals Placed Today: No orders of the defined types were placed in this encounter.  (Contact our referral department at [phone] you have not spoken with someone about your referral appointment within the next 5 days)    Follow-up Plan Follow-up with MLuetta Nutting DO as planned Schedule eye exam when you are able to.  We can complete the foot exam next time you are in the office. Medicare wellness visit in one year.  AVS printed and mailed to the patient.      Health Maintenance, Male Adopting a healthy lifestyle and getting preventive care are important in promoting health and wellness. Ask your health care provider about: The right schedule for you to have regular tests and exams. Things you can do on your own to prevent diseases and keep yourself healthy. What should I know about diet, weight, and exercise? Eat a healthy diet  Eat a diet that includes plenty of vegetables, fruits, low-fat dairy products, and lean protein. Do not eat a lot of foods that are high in solid fats, added sugars, or sodium. Maintain a healthy weight Body mass index (BMI) is a measurement that can be used to identify possible weight problems. It estimates body fat based on height and weight. Your health care provider can help determine your BMI and help you achieve or maintain a healthy weight. Get regular exercise Get regular exercise. This is one of the most important things you can do for your health. Most adults should:  Exercise for at least 150 minutes each week. The exercise should increase your heart rate and make you sweat (moderate-intensity exercise). Do strengthening exercises at least twice a week. This is in addition to the moderate-intensity exercise. Spend less time sitting. Even light physical activity can be beneficial. Watch cholesterol and blood lipids Have your blood tested for lipids and cholesterol at 73 years of age, then have this  test every 5 years. You may need to have your cholesterol levels checked more often if: Your lipid or cholesterol levels are high. You are older than 73 years of age. You are at high risk for heart disease. What should I know about cancer screening? Many types of cancers can be detected early and may often be prevented. Depending on your health history and family history, you may need to have cancer screening at various ages. This may include screening for: Colorectal cancer. Prostate cancer. Skin cancer. Lung cancer. What should I know about heart disease, diabetes, and high blood pressure? Blood pressure and heart disease High blood pressure causes heart disease and increases the risk of stroke. This is more likely to develop in people who have high blood pressure readings or are overweight. Talk with your health care provider about your target blood pressure readings. Have your blood pressure checked: Every 3-5 years if you are 54-80 years of age. Every year if you are 78 years old or older. If you are between the ages of 35 and 25 and are a current or former smoker, ask your health care provider if you should have a one-time screening for abdominal aortic aneurysm (AAA). Diabetes Have regular diabetes screenings. This checks your fasting blood sugar level. Have the screening done: Once every three years after age 52 if you are at a normal weight and have a low risk for diabetes. More often and at a younger age if you are overweight or have a high risk for diabetes. What should I know about preventing infection? Hepatitis B If you have a higher risk for hepatitis B, you should be screened for this virus. Talk with your health care provider to find out if you are at risk for hepatitis B infection. Hepatitis C Blood testing is recommended for: Everyone born from [birth year] through 1966. Anyone with known risk factors for hepatitis C. Sexually transmitted infections (STIs) You should be  screened each year for STIs, including gonorrhea and chlamydia, if: You are sexually active and are younger than 74 years of age. You are older than 73 years of age and your health care provider tells you that you are at risk for this type of infection. Your sexual activity has changed since you were last screened, and you are at increased risk for chlamydia or gonorrhea. Ask your health care provider if you are at risk. Ask your health care provider about whether you are at high risk for HIV. Your health care provider may recommend a prescription medicine to help prevent HIV infection. If you choose to take medicine to prevent HIV, you should first get tested for HIV. You should then be tested every 3 months for as long as you are taking the medicine. Follow these instructions at home: Alcohol use Do not drink alcohol if your health care provider tells you not to drink. If you drink alcohol: Limit how much you have to 0-2 drinks a day. Know how much alcohol is in your drink. In the [place] one drink equals one 12 oz bottle of beer (355 mL), one  5 oz glass of wine (148 mL), or one 1 oz glass of hard liquor (44 mL). Lifestyle Do not use any products that contain nicotine or tobacco. These products include cigarettes, chewing tobacco, and vaping devices, such as e-cigarettes. If you need help quitting, ask your health care provider. Do not use street drugs. Do not share needles. Ask your health care provider for help if you need support or information about quitting drugs. General instructions Schedule regular health, dental, and eye exams. Stay current with your vaccines. Tell your health care provider if: You often feel depressed. You have ever been abused or do not feel safe at home. Summary Adopting a healthy lifestyle and getting preventive care are important in promoting health and wellness. Follow your health care provider's instructions about healthy diet, exercising, and getting tested  or screened for diseases. Follow your health care provider's instructions on monitoring your cholesterol and blood pressure. This information is not intended to replace advice given to you by your health care provider. Make sure you discuss any questions you have with your health care provider. Document Revised: 10/19/2020 Document Reviewed: 10/19/2020 Elsevier Patient Education  [place].

## 2022-07-14 ENCOUNTER — Other Ambulatory Visit: Payer: Self-pay | Admitting: Family Medicine

## 2022-07-14 DIAGNOSIS — Z794 Long term (current) use of insulin: Secondary | ICD-10-CM

## 2022-07-25 ENCOUNTER — Other Ambulatory Visit: Payer: Self-pay | Admitting: Family Medicine

## 2022-07-25 DIAGNOSIS — E1159 Type 2 diabetes mellitus with other circulatory complications: Secondary | ICD-10-CM

## 2022-07-27 ENCOUNTER — Telehealth: Payer: Self-pay

## 2022-07-27 NOTE — Transitions of Care (Post Inpatient/ED Visit) (Signed)
   07/27/2022  Name: [name] MRN: [id number] DOB: Feb 28, 1951  Today's TOC FU Call Status: Today's TOC FU Call Status:: Unsuccessul Call (1st Attempt) Unsuccessful Call (1st Attempt) Date: 07/27/22  Attempted to reach the patient regarding the most recent Inpatient/ED visit.  Follow Up Plan: Additional outreach attempts will be made to reach the patient to complete the Transitions of Care (Post Inpatient/ED visit) call.   Signature Juanda Crumble, [place] Direct Dial [phone]

## 2022-07-28 ENCOUNTER — Telehealth: Payer: Self-pay | Admitting: *Deleted

## 2022-07-28 NOTE — Telephone Encounter (Signed)
Spoke with pt, he is needing an US aorta due to AAA. H reports he just got out of the hospital having spinal surgery and once he is able to walk and get around, he will call to get scheduled.

## 2022-07-28 NOTE — Transitions of Care (Post Inpatient/ED Visit) (Signed)
   07/28/2022  Name: [name] MRN: [id number] DOB: 1950/12/18  Today's TOC FU Call Status: Today's TOC FU Call Status:: Unsuccessful Call (2nd Attempt) Unsuccessful Call (1st Attempt) Date: 07/27/22 Unsuccessful Call (2nd Attempt) Date: 07/28/22  Attempted to reach the patient regarding the most recent Inpatient/ED visit.  Follow Up Plan: Additional outreach attempts will be made to reach the patient to complete the Transitions of Care (Post Inpatient/ED visit) call.   Signature Juanda Crumble, [place] Direct Dial [phone]

## 2022-07-29 ENCOUNTER — Other Ambulatory Visit: Payer: Self-pay | Admitting: Family Medicine

## 2022-07-29 ENCOUNTER — Telehealth: Payer: Self-pay

## 2022-07-29 DIAGNOSIS — Z794 Long term (current) use of insulin: Secondary | ICD-10-CM

## 2022-07-29 DIAGNOSIS — I2510 Atherosclerotic heart disease of native coronary artery without angina pectoris: Secondary | ICD-10-CM

## 2022-07-29 NOTE — Telephone Encounter (Signed)
Rcvd vm requesting VO's for OT & PT evaluation next week  OT = 1 x 4 weeks  VO's given.

## 2022-07-29 NOTE — Transitions of Care (Post Inpatient/ED Visit) (Signed)
   07/29/2022  Name: [name] MRN: [id number] DOB: 1950-09-22  Today's TOC FU Call Status: Today's TOC FU Call Status:: Unsuccessful Call (3rd Attempt) Unsuccessful Call (1st Attempt) Date: 07/27/22 Unsuccessful Call (2nd Attempt) Date: 07/28/22 Unsuccessful Call (3rd Attempt) Date: 07/29/22  Attempted to reach the patient regarding the most recent Inpatient/ED visit.  Follow Up Plan: No further outreach attempts will be made at this time. We have been unable to contact the patient.  Signature  Juanda Crumble, [place] Direct Dial [phone]

## 2022-07-31 ENCOUNTER — Other Ambulatory Visit: Payer: Self-pay | Admitting: Family Medicine

## 2022-07-31 NOTE — Telephone Encounter (Signed)
Agree with VO.  Thanks!  CM

## 2022-08-04 ENCOUNTER — Telehealth: Payer: Self-pay | Admitting: *Deleted

## 2022-08-04 NOTE — Telephone Encounter (Signed)
Received request from [facility] @ [facility] requesting VO's for PT.  2 x 2wk 1 x 3 wk 1 EOD x 4 wk  Granted per Dr. Zigmund Daniel.

## 2022-08-04 NOTE — Telephone Encounter (Signed)
Patient called back to say that he will have to schedule at a later date. He just had spinal surgery and unable to walk at the moment. Please advise

## 2022-08-04 NOTE — Telephone Encounter (Signed)
Left message for pt to call, he had a CT scan done at [facility] in January and the AAA was only partially seen. Dr Stanford Breed has reviewed and would like the patient to have an abdominal US to follow up on the size of the aortic aneurysm. That scan can be done in the imaging department in the [place] office. Their number is [phone] if he would like to call and schedule or I can schedule for him if he would like this testing done.

## 2022-08-12 DIAGNOSIS — E119 Type 2 diabetes mellitus without complications: Secondary | ICD-10-CM | POA: Diagnosis not present

## 2022-08-12 DIAGNOSIS — M5441 Lumbago with sciatica, right side: Secondary | ICD-10-CM | POA: Diagnosis not present

## 2022-08-12 DIAGNOSIS — I10 Essential (primary) hypertension: Secondary | ICD-10-CM | POA: Diagnosis not present

## 2022-08-16 DIAGNOSIS — I2510 Atherosclerotic heart disease of native coronary artery without angina pectoris: Secondary | ICD-10-CM | POA: Diagnosis not present

## 2022-08-16 DIAGNOSIS — M5441 Lumbago with sciatica, right side: Secondary | ICD-10-CM | POA: Diagnosis not present

## 2022-08-16 DIAGNOSIS — M810 Age-related osteoporosis without current pathological fracture: Secondary | ICD-10-CM | POA: Diagnosis not present

## 2022-08-16 DIAGNOSIS — E119 Type 2 diabetes mellitus without complications: Secondary | ICD-10-CM | POA: Diagnosis not present

## 2022-08-16 DIAGNOSIS — Z794 Long term (current) use of insulin: Secondary | ICD-10-CM | POA: Diagnosis not present

## 2022-08-16 DIAGNOSIS — I10 Essential (primary) hypertension: Secondary | ICD-10-CM | POA: Diagnosis not present

## 2022-08-16 DIAGNOSIS — Z4789 Encounter for other orthopedic aftercare: Secondary | ICD-10-CM | POA: Diagnosis not present

## 2022-08-16 DIAGNOSIS — Z7984 Long term (current) use of oral hypoglycemic drugs: Secondary | ICD-10-CM | POA: Diagnosis not present

## 2022-08-16 DIAGNOSIS — M109 Gout, unspecified: Secondary | ICD-10-CM | POA: Diagnosis not present

## 2022-08-19 ENCOUNTER — Encounter: Payer: Self-pay | Admitting: Family Medicine

## 2022-08-19 ENCOUNTER — Ambulatory Visit (INDEPENDENT_AMBULATORY_CARE_PROVIDER_SITE_OTHER): Payer: Medicare Other | Admitting: Family Medicine

## 2022-08-19 VITALS — BP 135/75 | HR 96 | Ht 69.0 in | Wt 211.0 lb

## 2022-08-19 DIAGNOSIS — I10 Essential (primary) hypertension: Secondary | ICD-10-CM

## 2022-08-19 DIAGNOSIS — E781 Pure hyperglyceridemia: Secondary | ICD-10-CM

## 2022-08-19 DIAGNOSIS — Z9889 Other specified postprocedural states: Secondary | ICD-10-CM | POA: Diagnosis not present

## 2022-08-19 DIAGNOSIS — Z794 Long term (current) use of insulin: Secondary | ICD-10-CM | POA: Diagnosis not present

## 2022-08-19 DIAGNOSIS — E1159 Type 2 diabetes mellitus with other circulatory complications: Secondary | ICD-10-CM | POA: Diagnosis not present

## 2022-08-19 DIAGNOSIS — Z951 Presence of aortocoronary bypass graft: Secondary | ICD-10-CM

## 2022-08-19 DIAGNOSIS — E291 Testicular hypofunction: Secondary | ICD-10-CM

## 2022-08-19 LAB — POCT GLYCOSYLATED HEMOGLOBIN (HGB A1C): HbA1c, POC (controlled diabetic range): 6.8 % (ref 0.0–7.0)

## 2022-08-19 NOTE — Assessment & Plan Note (Signed)
Update testosterone levels.

## 2022-08-19 NOTE — Assessment & Plan Note (Signed)
BP is reasonably well controlled.  Continue amlodipine at current strength.

## 2022-08-19 NOTE — Assessment & Plan Note (Signed)
Improved since spinal surgery, he will continue PT.
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
## 2022-08-19 NOTE — Assessment & Plan Note (Signed)
Management per cardiology.  Continue asa and statin.

## 2022-08-19 NOTE — Assessment & Plan Note (Signed)
Diabetes is well controlled with current medications.  No hypoglycemic episodes.  Recommend continuation of current medications.

## 2022-08-20 LAB — COMPLETE METABOLIC PANEL WITH GFR
AG Ratio: 1.9 (calc) (ref 1.0–2.5)
ALT: 14 U/L (ref 9–46)
AST: 22 U/L (ref 10–35)
Albumin: 4.2 g/dL (ref 3.6–5.1)
Alkaline phosphatase (APISO): 91 U/L (ref 35–144)
BUN: 11 mg/dL (ref 7–25)
CO2: 24 mmol/L (ref 20–32)
Calcium: 9.5 mg/dL (ref 8.6–10.3)
Chloride: 107 mmol/L (ref 98–110)
Creat: 0.96 mg/dL (ref 0.70–1.28)
Globulin: 2.2 g/dL (calc) (ref 1.9–3.7)
Glucose, Bld: 108 mg/dL — ABNORMAL HIGH (ref 65–99)
Potassium: 4.8 mmol/L (ref 3.5–5.3)
Sodium: 141 mmol/L (ref 135–146)
Total Bilirubin: 0.5 mg/dL (ref 0.2–1.2)
Total Protein: 6.4 g/dL (ref 6.1–8.1)
eGFR: 84 mL/min/{1.73_m2} (ref >=60)

## 2022-08-20 LAB — CBC WITH DIFFERENTIAL/PLATELET
Absolute Monocytes: 497 cells/uL (ref 200–950)
Basophils Absolute: 108 cells/uL (ref 0–200)
Basophils Relative: 2 %
Eosinophils Absolute: 340 cells/uL (ref 15–500)
Eosinophils Relative: 6.3 %
HCT: 35.4 % — ABNORMAL LOW (ref 38.5–50.0)
Hemoglobin: 11.6 g/dL — ABNORMAL LOW (ref 13.2–17.1)
Lymphs Abs: 1166 cells/uL (ref 850–3900)
MCH: 28.4 pg (ref 27.0–33.0)
MCHC: 32.8 g/dL (ref 32.0–36.0)
MCV: 86.6 fL (ref 80.0–100.0)
MPV: 10.2 fL (ref 7.5–12.5)
Monocytes Relative: 9.2 %
Neutro Abs: 3289 cells/uL (ref 1500–7800)
Neutrophils Relative %: 60.9 %
Platelets: 199 10*3/uL (ref 140–400)
RBC: 4.09 10*6/uL — ABNORMAL LOW (ref 4.20–5.80)
RDW: 14.1 % (ref 11.0–15.0)
Total Lymphocyte: 21.6 %
WBC: 5.4 10*3/uL (ref 3.8–10.8)

## 2022-08-20 LAB — PSA: PSA: 0.58 ng/mL (ref <=4.00)

## 2022-08-20 LAB — LIPID PANEL W/REFLEX DIRECT LDL
Cholesterol: 113 mg/dL (ref <200)
HDL: 37 mg/dL — ABNORMAL LOW (ref >=40)
LDL Cholesterol (Calc): 50 mg/dL (calc)
Non-HDL Cholesterol (Calc): 76 mg/dL (calc) (ref <130)
Total CHOL/HDL Ratio: 3.1 (calc) (ref <5.0)
Triglycerides: 185 mg/dL — ABNORMAL HIGH (ref <150)

## 2022-08-20 LAB — TESTOSTERONE: Testosterone: 359 ng/dL (ref 250–827)

## 2022-08-22 DIAGNOSIS — Z9889 Other specified postprocedural states: Secondary | ICD-10-CM | POA: Diagnosis not present

## 2022-08-22 DIAGNOSIS — Z4889 Encounter for other specified surgical aftercare: Secondary | ICD-10-CM | POA: Diagnosis not present

## 2022-08-23 DIAGNOSIS — I10 Essential (primary) hypertension: Secondary | ICD-10-CM | POA: Diagnosis not present

## 2022-08-23 DIAGNOSIS — Z7984 Long term (current) use of oral hypoglycemic drugs: Secondary | ICD-10-CM | POA: Diagnosis not present

## 2022-08-23 DIAGNOSIS — I2510 Atherosclerotic heart disease of native coronary artery without angina pectoris: Secondary | ICD-10-CM | POA: Diagnosis not present

## 2022-08-23 DIAGNOSIS — M810 Age-related osteoporosis without current pathological fracture: Secondary | ICD-10-CM | POA: Diagnosis not present

## 2022-08-23 DIAGNOSIS — M109 Gout, unspecified: Secondary | ICD-10-CM | POA: Diagnosis not present

## 2022-08-23 DIAGNOSIS — M5441 Lumbago with sciatica, right side: Secondary | ICD-10-CM | POA: Diagnosis not present

## 2022-08-23 DIAGNOSIS — E119 Type 2 diabetes mellitus without complications: Secondary | ICD-10-CM | POA: Diagnosis not present

## 2022-08-23 DIAGNOSIS — Z794 Long term (current) use of insulin: Secondary | ICD-10-CM | POA: Diagnosis not present

## 2022-08-23 DIAGNOSIS — Z4789 Encounter for other orthopedic aftercare: Secondary | ICD-10-CM | POA: Diagnosis not present

## 2022-08-29 ENCOUNTER — Other Ambulatory Visit: Payer: Self-pay | Admitting: Family Medicine

## 2022-08-30 DIAGNOSIS — I10 Essential (primary) hypertension: Secondary | ICD-10-CM | POA: Diagnosis not present

## 2022-08-30 DIAGNOSIS — I2510 Atherosclerotic heart disease of native coronary artery without angina pectoris: Secondary | ICD-10-CM | POA: Diagnosis not present

## 2022-08-30 DIAGNOSIS — M810 Age-related osteoporosis without current pathological fracture: Secondary | ICD-10-CM | POA: Diagnosis not present

## 2022-08-30 DIAGNOSIS — M5441 Lumbago with sciatica, right side: Secondary | ICD-10-CM | POA: Diagnosis not present

## 2022-08-30 DIAGNOSIS — Z7984 Long term (current) use of oral hypoglycemic drugs: Secondary | ICD-10-CM | POA: Diagnosis not present

## 2022-08-30 DIAGNOSIS — Z4789 Encounter for other orthopedic aftercare: Secondary | ICD-10-CM | POA: Diagnosis not present

## 2022-08-30 DIAGNOSIS — Z794 Long term (current) use of insulin: Secondary | ICD-10-CM | POA: Diagnosis not present

## 2022-08-30 DIAGNOSIS — M109 Gout, unspecified: Secondary | ICD-10-CM | POA: Diagnosis not present

## 2022-08-30 DIAGNOSIS — E119 Type 2 diabetes mellitus without complications: Secondary | ICD-10-CM | POA: Diagnosis not present

## 2022-09-01 ENCOUNTER — Telehealth: Payer: Self-pay

## 2022-09-01 NOTE — Telephone Encounter (Signed)
Travis @ [facility] PT lvm requesting increased frequency to 1 x 4 weeks.   VO granted per Dr. Zigmund Daniel.

## 2022-09-08 DIAGNOSIS — Z4789 Encounter for other orthopedic aftercare: Secondary | ICD-10-CM | POA: Diagnosis not present

## 2022-09-08 DIAGNOSIS — M5441 Lumbago with sciatica, right side: Secondary | ICD-10-CM | POA: Diagnosis not present

## 2022-09-08 DIAGNOSIS — I10 Essential (primary) hypertension: Secondary | ICD-10-CM | POA: Diagnosis not present

## 2022-09-08 DIAGNOSIS — I2510 Atherosclerotic heart disease of native coronary artery without angina pectoris: Secondary | ICD-10-CM | POA: Diagnosis not present

## 2022-09-08 DIAGNOSIS — Z7984 Long term (current) use of oral hypoglycemic drugs: Secondary | ICD-10-CM | POA: Diagnosis not present

## 2022-09-08 DIAGNOSIS — Z794 Long term (current) use of insulin: Secondary | ICD-10-CM | POA: Diagnosis not present

## 2022-09-08 DIAGNOSIS — E119 Type 2 diabetes mellitus without complications: Secondary | ICD-10-CM | POA: Diagnosis not present

## 2022-09-08 DIAGNOSIS — M109 Gout, unspecified: Secondary | ICD-10-CM | POA: Diagnosis not present

## 2022-09-08 DIAGNOSIS — M810 Age-related osteoporosis without current pathological fracture: Secondary | ICD-10-CM | POA: Diagnosis not present

## 2022-09-13 DIAGNOSIS — E119 Type 2 diabetes mellitus without complications: Secondary | ICD-10-CM | POA: Diagnosis not present

## 2022-09-13 DIAGNOSIS — M810 Age-related osteoporosis without current pathological fracture: Secondary | ICD-10-CM | POA: Diagnosis not present

## 2022-09-13 DIAGNOSIS — Z7984 Long term (current) use of oral hypoglycemic drugs: Secondary | ICD-10-CM | POA: Diagnosis not present

## 2022-09-13 DIAGNOSIS — I10 Essential (primary) hypertension: Secondary | ICD-10-CM | POA: Diagnosis not present

## 2022-09-13 DIAGNOSIS — M5441 Lumbago with sciatica, right side: Secondary | ICD-10-CM | POA: Diagnosis not present

## 2022-09-13 DIAGNOSIS — M109 Gout, unspecified: Secondary | ICD-10-CM | POA: Diagnosis not present

## 2022-09-13 DIAGNOSIS — Z794 Long term (current) use of insulin: Secondary | ICD-10-CM | POA: Diagnosis not present

## 2022-09-13 DIAGNOSIS — Z4789 Encounter for other orthopedic aftercare: Secondary | ICD-10-CM | POA: Diagnosis not present

## 2022-09-13 DIAGNOSIS — I2510 Atherosclerotic heart disease of native coronary artery without angina pectoris: Secondary | ICD-10-CM | POA: Diagnosis not present

## 2022-09-20 ENCOUNTER — Telehealth: Payer: Self-pay

## 2022-09-20 DIAGNOSIS — E119 Type 2 diabetes mellitus without complications: Secondary | ICD-10-CM | POA: Diagnosis not present

## 2022-09-20 DIAGNOSIS — Z7984 Long term (current) use of oral hypoglycemic drugs: Secondary | ICD-10-CM | POA: Diagnosis not present

## 2022-09-20 DIAGNOSIS — M810 Age-related osteoporosis without current pathological fracture: Secondary | ICD-10-CM | POA: Diagnosis not present

## 2022-09-20 DIAGNOSIS — I10 Essential (primary) hypertension: Secondary | ICD-10-CM | POA: Diagnosis not present

## 2022-09-20 DIAGNOSIS — Z4789 Encounter for other orthopedic aftercare: Secondary | ICD-10-CM | POA: Diagnosis not present

## 2022-09-20 DIAGNOSIS — I2510 Atherosclerotic heart disease of native coronary artery without angina pectoris: Secondary | ICD-10-CM | POA: Diagnosis not present

## 2022-09-20 DIAGNOSIS — M5441 Lumbago with sciatica, right side: Secondary | ICD-10-CM | POA: Diagnosis not present

## 2022-09-20 DIAGNOSIS — Z794 Long term (current) use of insulin: Secondary | ICD-10-CM | POA: Diagnosis not present

## 2022-09-20 DIAGNOSIS — M109 Gout, unspecified: Secondary | ICD-10-CM | POA: Diagnosis not present

## 2022-09-20 NOTE — Telephone Encounter (Signed)
[facility] @ [facility] lvm stating Mr. Shaulis was released. Goals met. No devices. Pt back in the community.

## 2022-10-12 DIAGNOSIS — Z4889 Encounter for other specified surgical aftercare: Secondary | ICD-10-CM | POA: Diagnosis not present

## 2022-10-12 DIAGNOSIS — Z9889 Other specified postprocedural states: Secondary | ICD-10-CM | POA: Diagnosis not present

## 2022-11-28 ENCOUNTER — Other Ambulatory Visit: Payer: Self-pay | Admitting: Family Medicine

## 2022-11-29 ENCOUNTER — Ambulatory Visit (HOSPITAL_COMMUNITY)
Admission: RE | Admit: 2022-11-29 | Discharge: 2022-11-29 | Disposition: A | Discharge status: Home / Self Care | Payer: Medicare Other | Source: Ambulatory Visit | Attending: Cardiology | Admitting: Cardiology

## 2022-11-29 DIAGNOSIS — I714 Abdominal aortic aneurysm, without rupture, unspecified: Secondary | ICD-10-CM

## 2022-11-29 DIAGNOSIS — I7143 Infrarenal abdominal aortic aneurysm, without rupture: Secondary | ICD-10-CM | POA: Diagnosis not present

## 2022-11-30 ENCOUNTER — Encounter: Payer: Self-pay | Admitting: *Deleted

## 2022-12-12 ENCOUNTER — Other Ambulatory Visit: Payer: Self-pay | Admitting: Family Medicine

## 2022-12-12 DIAGNOSIS — E1159 Type 2 diabetes mellitus with other circulatory complications: Secondary | ICD-10-CM

## 2023-01-13 ENCOUNTER — Other Ambulatory Visit: Payer: Self-pay | Admitting: Family Medicine

## 2023-01-13 DIAGNOSIS — I2510 Atherosclerotic heart disease of native coronary artery without angina pectoris: Secondary | ICD-10-CM

## 2023-01-13 DIAGNOSIS — Z794 Long term (current) use of insulin: Secondary | ICD-10-CM

## 2023-02-20 ENCOUNTER — Encounter: Payer: Self-pay | Admitting: Family Medicine

## 2023-02-20 ENCOUNTER — Ambulatory Visit (INDEPENDENT_AMBULATORY_CARE_PROVIDER_SITE_OTHER): Payer: Medicare Other | Admitting: Family Medicine

## 2023-02-20 VITALS — BP 129/71 | HR 85 | Ht 69.0 in | Wt 208.0 lb

## 2023-02-20 DIAGNOSIS — M79604 Pain in right leg: Secondary | ICD-10-CM | POA: Diagnosis not present

## 2023-02-20 DIAGNOSIS — Z794 Long term (current) use of insulin: Secondary | ICD-10-CM | POA: Diagnosis not present

## 2023-02-20 DIAGNOSIS — M79605 Pain in left leg: Secondary | ICD-10-CM | POA: Diagnosis not present

## 2023-02-20 DIAGNOSIS — E1159 Type 2 diabetes mellitus with other circulatory complications: Secondary | ICD-10-CM | POA: Diagnosis not present

## 2023-02-20 DIAGNOSIS — G8929 Other chronic pain: Secondary | ICD-10-CM

## 2023-02-20 DIAGNOSIS — M792 Neuralgia and neuritis, unspecified: Secondary | ICD-10-CM

## 2023-02-20 DIAGNOSIS — I10 Essential (primary) hypertension: Secondary | ICD-10-CM

## 2023-02-20 DIAGNOSIS — E291 Testicular hypofunction: Secondary | ICD-10-CM | POA: Diagnosis not present

## 2023-02-20 LAB — POCT GLYCOSYLATED HEMOGLOBIN (HGB A1C): HbA1c, POC (controlled diabetic range): 6.5 % (ref 0.0–7.0)

## 2023-02-20 MED ORDER — PREGABALIN 150 MG PO CAPS: 150.0000 mg | ORAL_CAPSULE | Freq: Two times a day (BID) | ORAL | 1 refills | Status: DC

## 2023-02-20 NOTE — Assessment & Plan Note (Signed)
Diabetes is well controlled with current medications.  No hypoglycemic episodes.  Recommend continuation of current medications.
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
## 2023-02-20 NOTE — Assessment & Plan Note (Signed)
Doing with current testosterone supplementation.  Will plan to continue.Marland Kitchen

## 2023-02-20 NOTE — Assessment & Plan Note (Signed)
Continues to have neuropathic pain.  Switching gabapentin to Lyrica to see if this is more effective for him.  He continue Elavil at bedtime.  I declined to fill any opioid pain medications however I did offer a referral to pain management which he accepts.  Let him know that I understand that he is in pain however, his behavior including foul language and threatening behavior was not appropriate and future dialogue of this nature will result in dismissal.

## 2023-02-20 NOTE — Patient Instructions (Addendum)
Try adding alpha lipoic acid daily.  This is available over the counter.  Stop gabapentin.  Let's try lyrica.  Referral to pain management placed.

## 2023-02-20 NOTE — Assessment & Plan Note (Signed)
BP is reasonably well controlled.  Continue amlodipine at current strength.
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
## 2023-03-01 ENCOUNTER — Encounter: Payer: Self-pay | Admitting: Cardiology

## 2023-03-01 ENCOUNTER — Ambulatory Visit: Payer: Medicare Other | Admitting: Cardiology

## 2023-03-01 VITALS — BP 152/84 | HR 86 | Ht 69.0 in | Wt 212.0 lb

## 2023-03-01 DIAGNOSIS — I2510 Atherosclerotic heart disease of native coronary artery without angina pectoris: Secondary | ICD-10-CM

## 2023-03-01 DIAGNOSIS — I714 Abdominal aortic aneurysm, without rupture, unspecified: Secondary | ICD-10-CM

## 2023-03-01 DIAGNOSIS — I10 Essential (primary) hypertension: Secondary | ICD-10-CM | POA: Diagnosis not present

## 2023-03-01 DIAGNOSIS — R9431 Abnormal electrocardiogram [ECG] [EKG]: Secondary | ICD-10-CM | POA: Diagnosis not present

## 2023-03-01 NOTE — Patient Instructions (Signed)

## 2023-03-12 ENCOUNTER — Other Ambulatory Visit: Payer: Self-pay | Admitting: Family Medicine

## 2023-03-12 DIAGNOSIS — E1159 Type 2 diabetes mellitus with other circulatory complications: Secondary | ICD-10-CM

## 2023-03-17 ENCOUNTER — Other Ambulatory Visit: Payer: Self-pay | Admitting: Family Medicine

## 2023-03-17 DIAGNOSIS — Z1211 Encounter for screening for malignant neoplasm of colon: Secondary | ICD-10-CM

## 2023-03-17 DIAGNOSIS — Z1212 Encounter for screening for malignant neoplasm of rectum: Secondary | ICD-10-CM

## 2023-03-22 DIAGNOSIS — Z5181 Encounter for therapeutic drug level monitoring: Secondary | ICD-10-CM | POA: Diagnosis not present

## 2023-03-22 DIAGNOSIS — M5417 Radiculopathy, lumbosacral region: Secondary | ICD-10-CM | POA: Diagnosis not present

## 2023-03-22 DIAGNOSIS — Z79899 Other long term (current) drug therapy: Secondary | ICD-10-CM | POA: Diagnosis not present

## 2023-03-22 DIAGNOSIS — G894 Chronic pain syndrome: Secondary | ICD-10-CM | POA: Diagnosis not present

## 2023-03-22 DIAGNOSIS — Z9889 Other specified postprocedural states: Secondary | ICD-10-CM | POA: Diagnosis not present

## 2023-03-31 ENCOUNTER — Other Ambulatory Visit: Payer: Self-pay | Admitting: Family Medicine

## 2023-03-31 DIAGNOSIS — E1159 Type 2 diabetes mellitus with other circulatory complications: Secondary | ICD-10-CM

## 2023-04-13 ENCOUNTER — Other Ambulatory Visit: Payer: Self-pay | Admitting: Family Medicine

## 2023-04-13 DIAGNOSIS — M1A00X0 Idiopathic chronic gout, unspecified site, without tophus (tophi): Secondary | ICD-10-CM

## 2023-04-13 DIAGNOSIS — I10 Essential (primary) hypertension: Secondary | ICD-10-CM

## 2023-04-19 DIAGNOSIS — G5793 Unspecified mononeuropathy of bilateral lower limbs: Secondary | ICD-10-CM | POA: Diagnosis not present

## 2023-04-19 DIAGNOSIS — Z9889 Other specified postprocedural states: Secondary | ICD-10-CM | POA: Diagnosis not present

## 2023-04-19 DIAGNOSIS — G894 Chronic pain syndrome: Secondary | ICD-10-CM | POA: Diagnosis not present

## 2023-05-19 ENCOUNTER — Other Ambulatory Visit: Payer: Self-pay

## 2023-05-29 ENCOUNTER — Other Ambulatory Visit: Payer: Self-pay | Admitting: Family Medicine

## 2023-05-29 DIAGNOSIS — E1159 Type 2 diabetes mellitus with other circulatory complications: Secondary | ICD-10-CM

## 2023-06-08 ENCOUNTER — Other Ambulatory Visit: Payer: Self-pay | Admitting: Family Medicine

## 2023-06-21 DIAGNOSIS — Z9889 Other specified postprocedural states: Secondary | ICD-10-CM | POA: Diagnosis not present

## 2023-06-21 DIAGNOSIS — M5417 Radiculopathy, lumbosacral region: Secondary | ICD-10-CM | POA: Diagnosis not present

## 2023-06-21 DIAGNOSIS — G894 Chronic pain syndrome: Secondary | ICD-10-CM | POA: Diagnosis not present

## 2023-07-10 ENCOUNTER — Ambulatory Visit (INDEPENDENT_AMBULATORY_CARE_PROVIDER_SITE_OTHER): Payer: Medicare Other | Admitting: Family Medicine

## 2023-07-10 ENCOUNTER — Encounter: Payer: Self-pay | Admitting: Family Medicine

## 2023-07-10 VITALS — Ht 69.0 in | Wt 207.0 lb

## 2023-07-10 DIAGNOSIS — Z0000 Encounter for general adult medical examination without abnormal findings: Secondary | ICD-10-CM
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
## 2023-07-11 ENCOUNTER — Other Ambulatory Visit: Payer: Self-pay | Admitting: Family Medicine

## 2023-07-11 DIAGNOSIS — E1159 Type 2 diabetes mellitus with other circulatory complications: Secondary | ICD-10-CM

## 2023-07-11 DIAGNOSIS — I2510 Atherosclerotic heart disease of native coronary artery without angina pectoris: Secondary | ICD-10-CM

## 2023-08-07 ENCOUNTER — Other Ambulatory Visit: Payer: Self-pay | Admitting: Family Medicine

## 2023-08-09 NOTE — Telephone Encounter (Signed)
Called patient, LVM to call office to schedule appointment, thanks.  

## 2023-08-14 ENCOUNTER — Encounter: Payer: Self-pay | Admitting: Family Medicine

## 2023-08-14 ENCOUNTER — Ambulatory Visit (INDEPENDENT_AMBULATORY_CARE_PROVIDER_SITE_OTHER): Payer: Medicare Other | Admitting: Family Medicine

## 2023-08-14 VITALS — BP 124/70 | HR 95 | Ht 69.0 in | Wt 208.0 lb

## 2023-08-14 DIAGNOSIS — E781 Pure hyperglyceridemia: Secondary | ICD-10-CM | POA: Diagnosis not present

## 2023-08-14 DIAGNOSIS — Z794 Long term (current) use of insulin: Secondary | ICD-10-CM

## 2023-08-14 DIAGNOSIS — E1159 Type 2 diabetes mellitus with other circulatory complications: Secondary | ICD-10-CM | POA: Diagnosis not present

## 2023-08-14 DIAGNOSIS — E291 Testicular hypofunction: Secondary | ICD-10-CM | POA: Diagnosis not present

## 2023-08-14 DIAGNOSIS — M792 Neuralgia and neuritis, unspecified: Secondary | ICD-10-CM

## 2023-08-14 DIAGNOSIS — I10 Essential (primary) hypertension: Secondary | ICD-10-CM

## 2023-08-14 LAB — POCT GLYCOSYLATED HEMOGLOBIN (HGB A1C): HbA1c, POC (controlled diabetic range): 6.7 % (ref 0.0–7.0)

## 2023-08-14 MED ORDER — CELECOXIB 200 MG PO CAPS: ORAL_CAPSULE | ORAL | 1 refills | Status: DC

## 2023-08-14 NOTE — Assessment & Plan Note (Signed)
 He has not been using testosterone over the past few months.  Experiencing erectile issues.  He is planning on adding this back on.

## 2023-08-14 NOTE — Patient Instructions (Addendum)
 Try adding alpha lipoic acid supplement.  Ang[employer] S[employer], is one brand that contains this.

## 2023-08-14 NOTE — Assessment & Plan Note (Signed)
Diabetes is well controlled with current medications.  No hypoglycemic episodes.  Recommend continuation of current medications.

## 2023-08-14 NOTE — Assessment & Plan Note (Signed)
BP is reasonably well controlled.  Continue amlodipine at current strength.

## 2023-08-14 NOTE — Assessment & Plan Note (Signed)
 Continues to have neuropathic pain.  Lyrica seems more effective for him.  Continues to see pain management as well.  Celebrex renewed today.  We discussed trying alpha lipoic acid for continued neuropathic pain in his feet.
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
## 2023-08-16 DIAGNOSIS — G5793 Unspecified mononeuropathy of bilateral lower limbs: Secondary | ICD-10-CM | POA: Diagnosis not present

## 2023-08-16 DIAGNOSIS — Z9889 Other specified postprocedural states: Secondary | ICD-10-CM | POA: Diagnosis not present

## 2023-08-16 DIAGNOSIS — M5417 Radiculopathy, lumbosacral region: Secondary | ICD-10-CM | POA: Diagnosis not present

## 2023-08-16 DIAGNOSIS — G894 Chronic pain syndrome: Secondary | ICD-10-CM | POA: Diagnosis not present

## 2023-08-16 LAB — PSA: Prostate Specific Ag, Serum: 0.5 ng/mL (ref 0.0–4.0)

## 2023-08-16 LAB — LIPID PANEL
Chol/HDL Ratio: 3.7 ratio (ref 0.0–5.0)
Cholesterol, Total: 99 mg/dL — ABNORMAL LOW (ref 100–199)
HDL: 27 mg/dL — ABNORMAL LOW (ref >39)
LDL Chol Calc (NIH): 35 mg/dL (ref 0–99)
Triglycerides: 234 mg/dL — ABNORMAL HIGH (ref 0–149)
VLDL Cholesterol Cal: 37 mg/dL (ref 5–40)

## 2023-08-16 LAB — CBC WITH DIFFERENTIAL/PLATELET
Basophils Absolute: 0.1 10*3/uL (ref 0.0–0.2)
Basos: 2 %
EOS (ABSOLUTE): 0.2 10*3/uL (ref 0.0–0.4)
Eos: 4 %
Hematocrit: 38.5 % (ref 37.5–51.0)
Hemoglobin: 12.6 g/dL — ABNORMAL LOW (ref 13.0–17.7)
Immature Grans (Abs): 0 10*3/uL (ref 0.0–0.1)
Immature Granulocytes: 0 %
Lymphocytes Absolute: 1.6 10*3/uL (ref 0.7–3.1)
Lymphs: 27 %
MCH: 28.6 pg (ref 26.6–33.0)
MCHC: 32.7 g/dL (ref 31.5–35.7)
MCV: 88 fL (ref 79–97)
Monocytes Absolute: 0.5 10*3/uL (ref 0.1–0.9)
Monocytes: 8 %
Neutrophils Absolute: 3.5 10*3/uL (ref 1.4–7.0)
Neutrophils: 59 %
Platelets: 185 10*3/uL (ref 150–450)
RBC: 4.4 x10E6/uL (ref 4.14–5.80)
RDW: 14.1 % (ref 11.6–15.4)
WBC: 5.8 10*3/uL (ref 3.4–10.8)

## 2023-08-16 LAB — CMP14+EGFR
ALT: 17 IU/L (ref 0–44)
AST: 23 IU/L (ref 0–40)
Albumin: 4.6 g/dL (ref 3.8–4.8)
Alkaline Phosphatase: 110 IU/L (ref 44–121)
BUN/Creatinine Ratio: 14 (ref 10–24)
BUN: 14 mg/dL (ref 8–27)
Bilirubin Total: 0.5 mg/dL (ref 0.0–1.2)
CO2: 21 mmol/L (ref 20–29)
Calcium: 9.7 mg/dL (ref 8.6–10.2)
Chloride: 106 mmol/L (ref 96–106)
Creatinine, Ser: 1.01 mg/dL (ref 0.76–1.27)
Globulin, Total: 2.1 g/dL (ref 1.5–4.5)
Glucose: 83 mg/dL (ref 70–99)
Potassium: 4.7 mmol/L (ref 3.5–5.2)
Sodium: 141 mmol/L (ref 134–144)
Total Protein: 6.7 g/dL (ref 6.0–8.5)
eGFR: 79 mL/min/{1.73_m2} (ref >59)

## 2023-08-16 LAB — MICROALBUMIN / CREATININE URINE RATIO
Creatinine, Urine: 176.8 mg/dL
Microalb/Creat Ratio: 137 mg/g{creat} — ABNORMAL HIGH (ref 0–29)
Microalbumin, Urine: 242.2 ug/mL

## 2023-08-16 LAB — TESTOSTERONE: Testosterone: 306 ng/dL (ref 264–916)

## 2023-09-06 ENCOUNTER — Other Ambulatory Visit: Payer: Self-pay | Admitting: Family Medicine

## 2023-09-28 NOTE — Progress Notes (Signed)
 [facility] Quality Team Note  Name: [name] Date of Birth: [birth year] MRN: [id number] Date: 09/28/2023  [facility] Quality Team has reviewed this patient's chart, please see recommendations below:  [facility] Quality Other; (PATIENT DUE FOR COLON CANCER SCREENING, PLEASE MAKE A NOTE TO ADDRESS DURING UPCOMING OFFICE VISIT 02/14/2024)

## 2023-10-05 NOTE — Progress Notes (Signed)
 Added message to appointment notes.

## 2023-10-09 ENCOUNTER — Other Ambulatory Visit: Payer: Self-pay | Admitting: Family Medicine

## 2023-10-09 DIAGNOSIS — E1159 Type 2 diabetes mellitus with other circulatory complications: Secondary | ICD-10-CM

## 2023-10-11 DIAGNOSIS — Z9889 Other specified postprocedural states: Secondary | ICD-10-CM | POA: Diagnosis not present

## 2023-10-11 DIAGNOSIS — G5793 Unspecified mononeuropathy of bilateral lower limbs: Secondary | ICD-10-CM | POA: Diagnosis not present

## 2023-10-11 DIAGNOSIS — M5417 Radiculopathy, lumbosacral region: Secondary | ICD-10-CM | POA: Diagnosis not present

## 2023-10-11 DIAGNOSIS — G894 Chronic pain syndrome: Secondary | ICD-10-CM | POA: Diagnosis not present

## 2023-10-16 ENCOUNTER — Other Ambulatory Visit: Payer: Self-pay | Admitting: Family Medicine

## 2023-10-16 DIAGNOSIS — Z794 Long term (current) use of insulin: Secondary | ICD-10-CM

## 2023-10-24 ENCOUNTER — Other Ambulatory Visit: Payer: Self-pay | Admitting: *Deleted

## 2023-10-24 DIAGNOSIS — I714 Abdominal aortic aneurysm, without rupture, unspecified: Secondary | ICD-10-CM

## 2023-12-20 IMAGING — US US AORTA
1 series · 14 of 23 positions shown · non-contrast
Comparison: Previous studies including the examination of
07/02/2020

CLINICAL DATA: Abdominal aortic aneurysm

EXAM:
ULTRASOUND OF ABDOMINAL AORTA
TECHNIQUE: Ultrasound examination of the abdominal aorta and proximal common
iliac arteries was performed to evaluate for aneurysm. Additional
color and Doppler images of the distal aorta were obtained to
document patency.

[Series 1: us aorta · 14 of 23 slices shown]
[im 1/23]
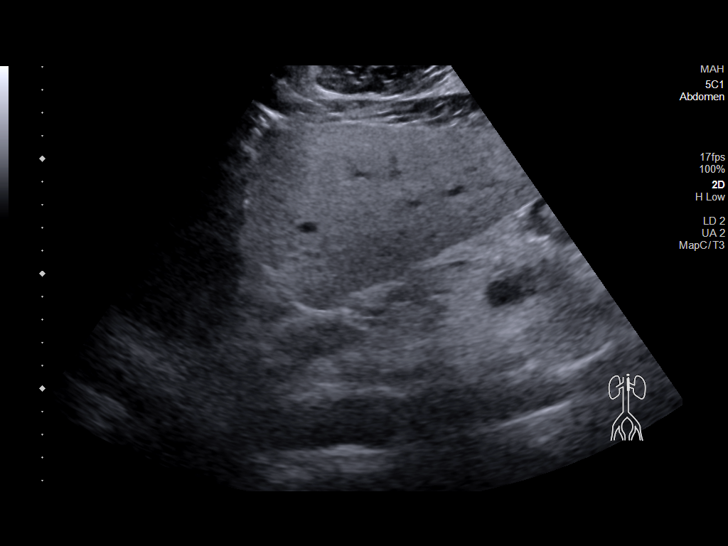
[im 3/23]
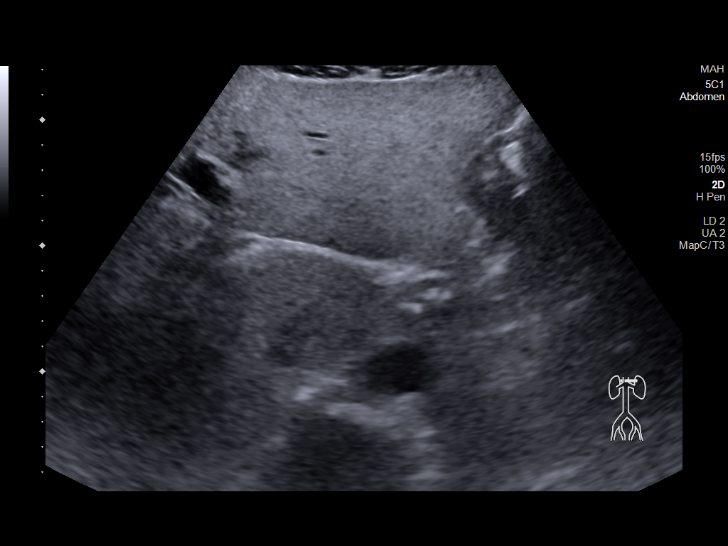
[im 5/23]
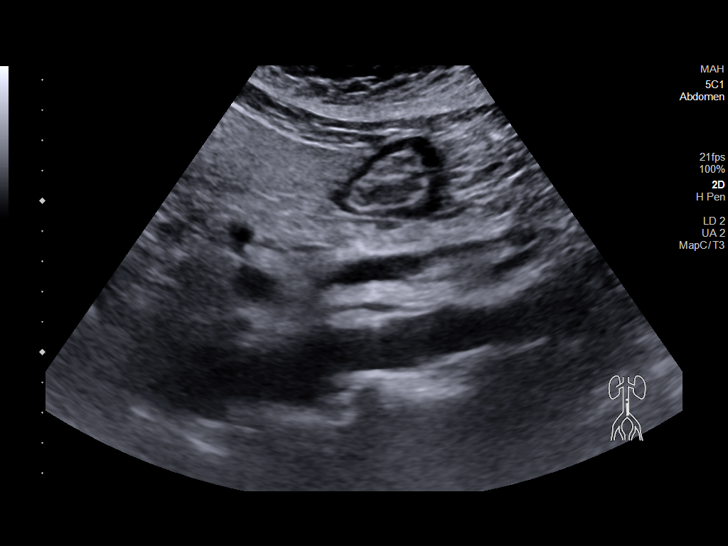
[im 6/23]
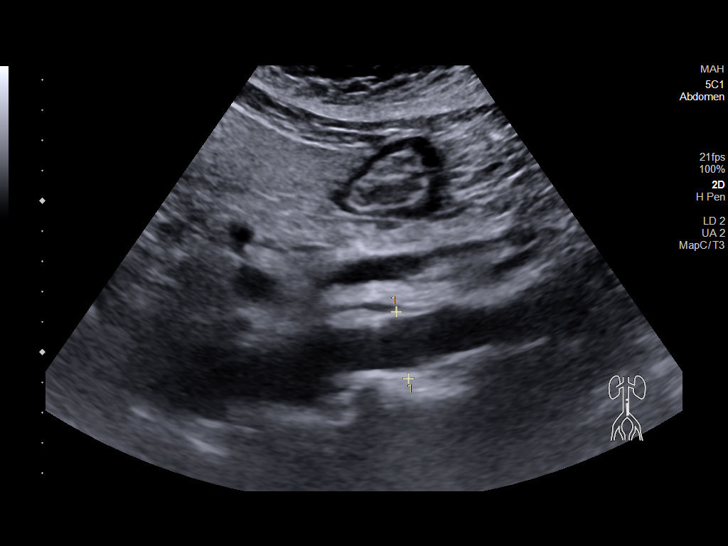
[im 8/23]
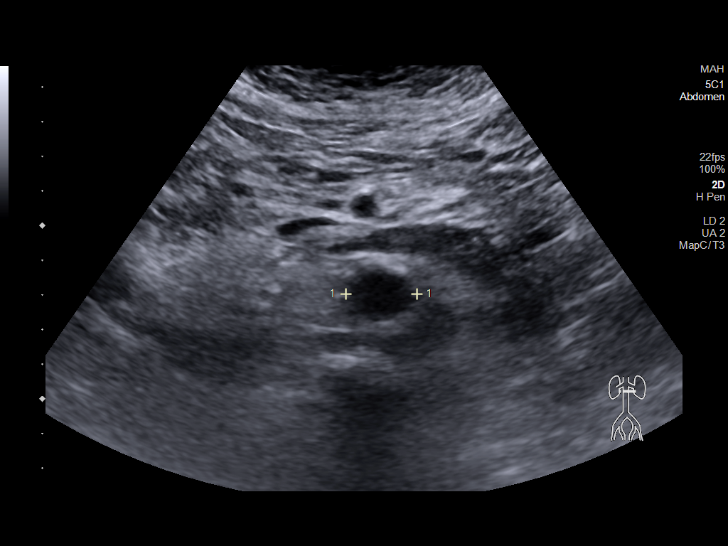
[im 10/23]
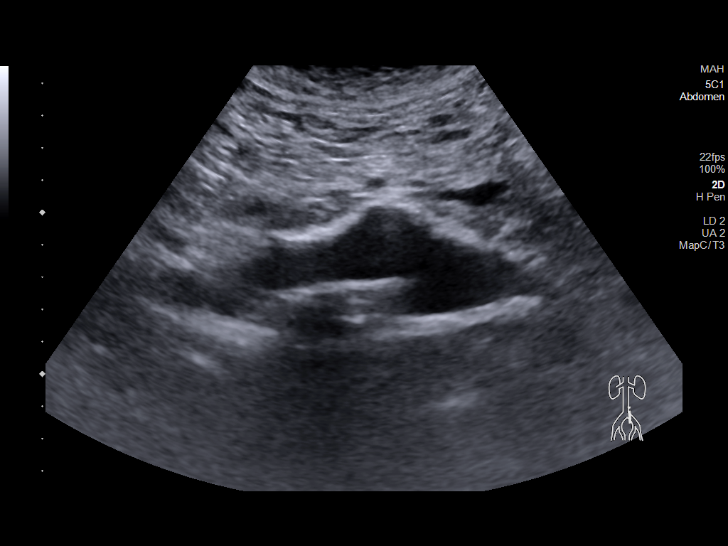
[im 11/23]
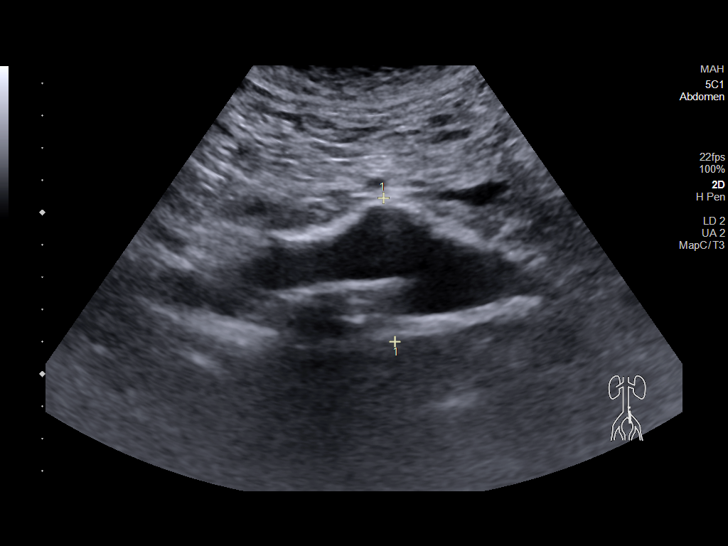
[im 13/23]
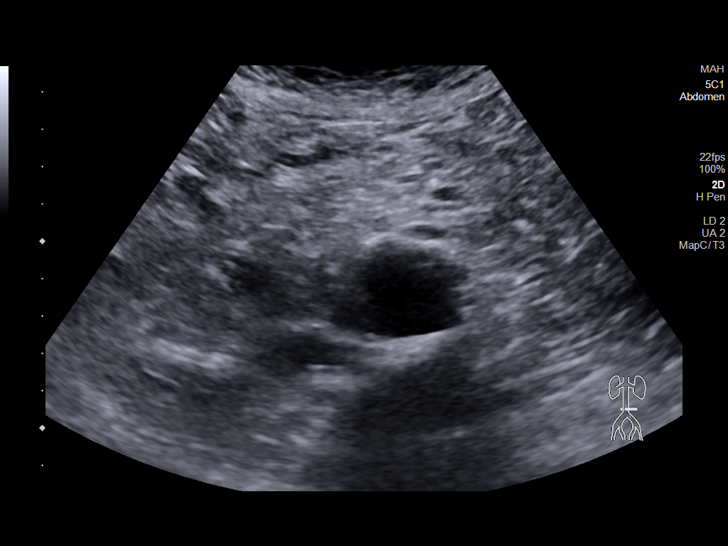
[im 14/23]
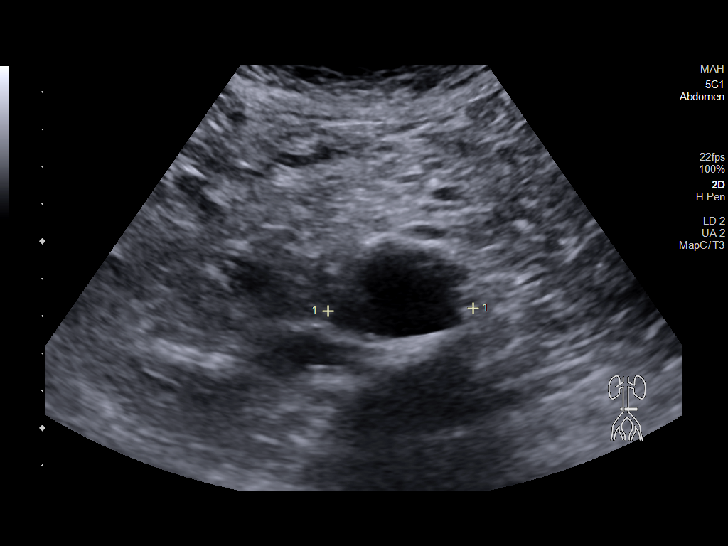
[im 16/23]
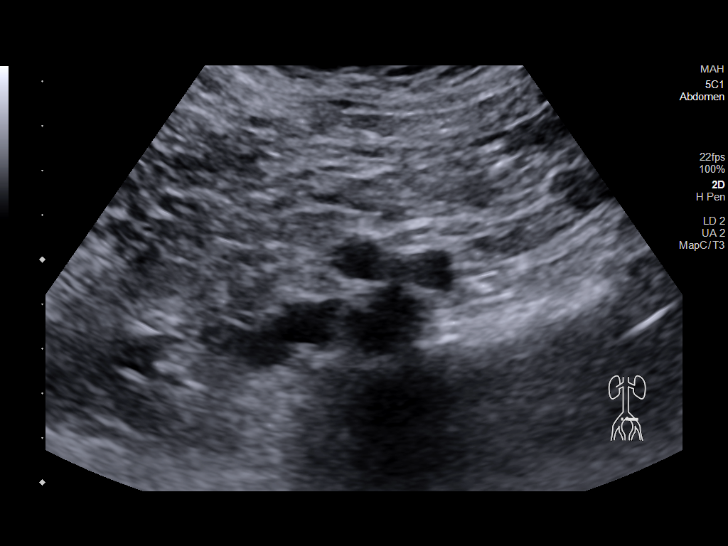
[im 18/23]
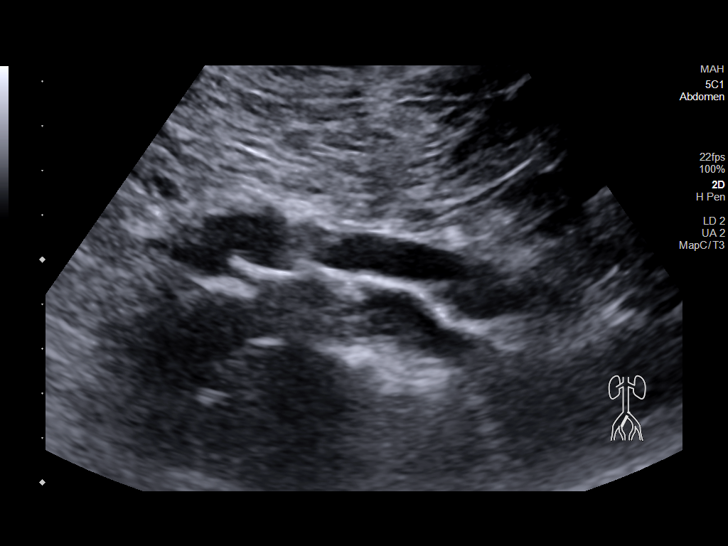
[im 19/23]
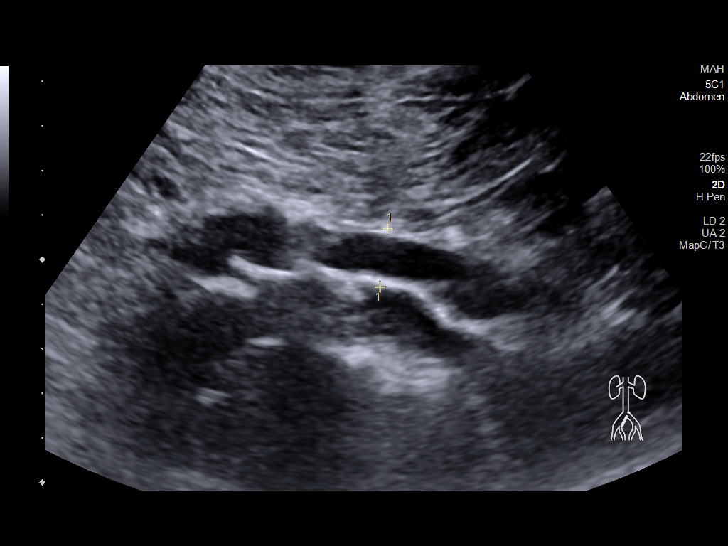
[im 21/23]
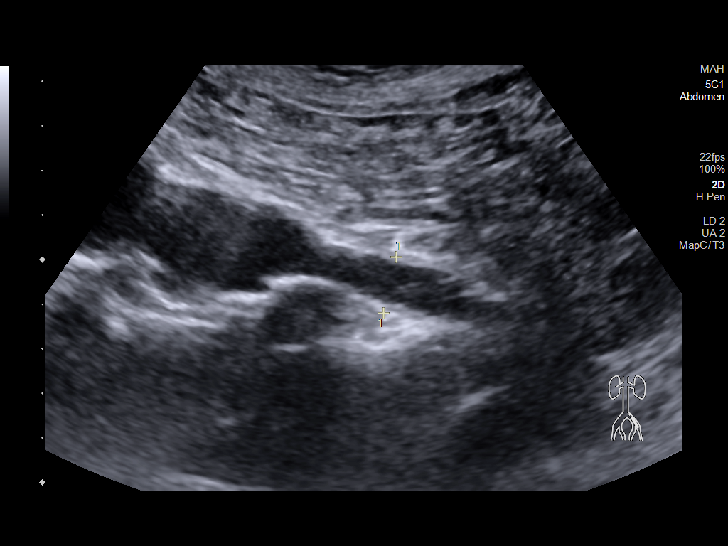
[im 23/23]
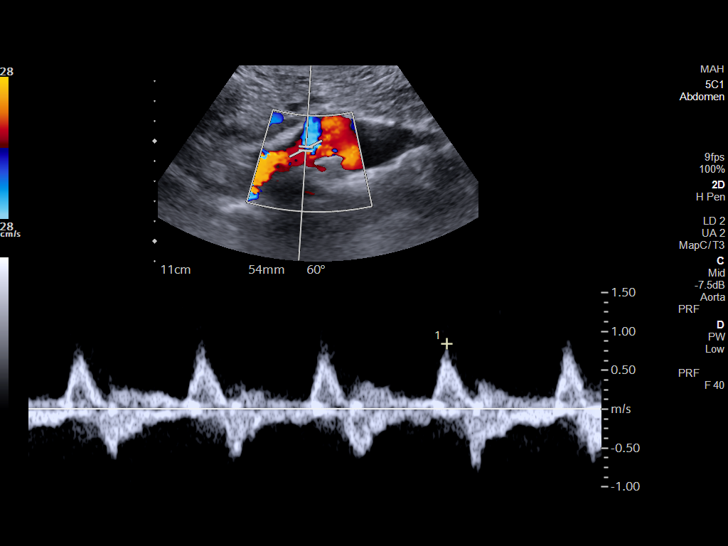

[14 of 23 positions shown; findings below may reference images not displayed]

FINDINGS: Abdominal aortic measurements as follows:

Proximal:  2.9 x 2.7 cm

Mid:  2.2 x 2.1 cm

Distal:  4.5 x 3.9 cm
Patent: Yes, peak systolic velocity is 84.2 cm/s

Right common iliac artery: 1.3 x 1.2 cm

Left common iliac artery: 1.3 x 1.2 cm
IMPRESSION: Atherosclerotic changes are noted in the abdominal aorta. There is a
4.5 cm infrarenal aortic aneurysm which measured 4.3 cm in the
previous study.

## 2024-01-01 ENCOUNTER — Ambulatory Visit

## 2024-01-01 DIAGNOSIS — I714 Abdominal aortic aneurysm, without rupture, unspecified: Secondary | ICD-10-CM | POA: Diagnosis not present

## 2024-01-06 ENCOUNTER — Ambulatory Visit: Payer: Self-pay | Admitting: Cardiology

## 2024-01-16 ENCOUNTER — Other Ambulatory Visit: Payer: Self-pay | Admitting: Family Medicine

## 2024-01-23 ENCOUNTER — Other Ambulatory Visit: Payer: Self-pay | Admitting: Family Medicine

## 2024-01-23 DIAGNOSIS — Z794 Long term (current) use of insulin: Secondary | ICD-10-CM

## 2024-01-29 ENCOUNTER — Telehealth: Payer: Self-pay

## 2024-01-29 ENCOUNTER — Other Ambulatory Visit: Payer: Self-pay | Admitting: Family Medicine

## 2024-01-29 DIAGNOSIS — E1159 Type 2 diabetes mellitus with other circulatory complications: Secondary | ICD-10-CM

## 2024-01-29 NOTE — Telephone Encounter (Signed)
 Spoke with [facility] - [facility] They did receive the prescription for Toujeo  but did not have enough to fill the entire order.  They are ordering the remaining amount needed.  States patient can pick up a partial amount as needed   until the remaining amount comes in.  Patient informed and will contact pharmacy to initiate this.

## 2024-01-29 NOTE — Telephone Encounter (Signed)
 Copied from CRM #[id number]. Topic: Clinical - Prescription Issue >> Jan 29, 2024 12:22 PM Dominique A wrote: Reason for CRM: Patient states that he has been out of his medication for 5 days: TOUJEO  MAX SOLOSTAR 300 UNIT/ML Solostar Pen. He just got off the phone with the pharmacy and they told him the medication was not sent in. In the system it is showing prescription was sent to the pharmacy today at 10:09 am and showing confirmed receipt by the pharmacy. Patient is wanting to know if it can be sent back in.

## 2024-01-30 ENCOUNTER — Other Ambulatory Visit: Payer: Self-pay | Admitting: Family Medicine

## 2024-01-30 DIAGNOSIS — I2510 Atherosclerotic heart disease of native coronary artery without angina pectoris: Secondary | ICD-10-CM

## 2024-01-30 DIAGNOSIS — Z794 Long term (current) use of insulin: Secondary | ICD-10-CM

## 2024-02-06 DIAGNOSIS — L304 Erythema intertrigo: Secondary | ICD-10-CM | POA: Diagnosis not present

## 2024-02-06 DIAGNOSIS — L218 Other seborrheic dermatitis: Secondary | ICD-10-CM | POA: Diagnosis not present

## 2024-02-06 DIAGNOSIS — L821 Other seborrheic keratosis: Secondary | ICD-10-CM | POA: Diagnosis not present

## 2024-02-12 ENCOUNTER — Other Ambulatory Visit: Payer: Self-pay | Admitting: Family Medicine

## 2024-02-14 ENCOUNTER — Encounter: Payer: Self-pay | Admitting: Family Medicine

## 2024-02-14 ENCOUNTER — Ambulatory Visit (INDEPENDENT_AMBULATORY_CARE_PROVIDER_SITE_OTHER): Admitting: Family Medicine

## 2024-02-14 VITALS — BP 102/63 | HR 99 | Ht 69.0 in | Wt 212.0 lb

## 2024-02-14 DIAGNOSIS — M792 Neuralgia and neuritis, unspecified: Secondary | ICD-10-CM | POA: Diagnosis not present

## 2024-02-14 DIAGNOSIS — I10 Essential (primary) hypertension: Secondary | ICD-10-CM

## 2024-02-14 DIAGNOSIS — E1159 Type 2 diabetes mellitus with other circulatory complications: Secondary | ICD-10-CM

## 2024-02-14 DIAGNOSIS — Z7984 Long term (current) use of oral hypoglycemic drugs: Secondary | ICD-10-CM

## 2024-02-14 DIAGNOSIS — M1A9XX1 Chronic gout, unspecified, with tophus (tophi): Secondary | ICD-10-CM

## 2024-02-14 DIAGNOSIS — E291 Testicular hypofunction: Secondary | ICD-10-CM

## 2024-02-14 DIAGNOSIS — E1142 Type 2 diabetes mellitus with diabetic polyneuropathy: Secondary | ICD-10-CM

## 2024-02-14 DIAGNOSIS — I739 Peripheral vascular disease, unspecified: Secondary | ICD-10-CM | POA: Diagnosis not present

## 2024-02-14 LAB — POCT GLYCOSYLATED HEMOGLOBIN (HGB A1C): HbA1c, POC (controlled diabetic range): 7.6 % — AB (ref 0.0–7.0)

## 2024-02-14 MED ORDER — TADALAFIL 10 MG PO TABS: 10.0000 mg | ORAL_TABLET | ORAL | 1 refills | Status: DC | PRN

## 2024-02-14 NOTE — Assessment & Plan Note (Signed)
 He has not been using testosterone  over the past few months.  Experiencing erectile issues.  He is planning on adding this back on.  Additionally, will add tadalafil  as needed for ED.  Side effects of medication reviewed.

## 2024-02-14 NOTE — Assessment & Plan Note (Signed)
 Continues to have pain as well as claudication symptoms.  He does have diminished pulses in bilateral lower extremities.  Checking ABIs..  Will plan to continue current medications for pain management.  Additionally, he will continue to see interventional pain management.
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
## 2024-02-14 NOTE — Assessment & Plan Note (Signed)
BP is reasonably well controlled.  Continue amlodipine at current strength.

## 2024-02-14 NOTE — Assessment & Plan Note (Addendum)
 A1c has worsened since last visit.  He will work on dietary changes as well as increasing activity level.  Continue current medications.  Follow-up in 3 to 4 months.

## 2024-02-22 ENCOUNTER — Telehealth: Payer: Self-pay

## 2024-02-22 NOTE — Telephone Encounter (Signed)
 Left message requesting a call back for colon cancer screening.

## 2024-02-29 ENCOUNTER — Ambulatory Visit (INDEPENDENT_AMBULATORY_CARE_PROVIDER_SITE_OTHER)

## 2024-02-29 ENCOUNTER — Telehealth: Payer: Self-pay | Admitting: *Deleted

## 2024-02-29 ENCOUNTER — Other Ambulatory Visit: Payer: Self-pay | Admitting: Podiatry

## 2024-02-29 ENCOUNTER — Encounter: Payer: Self-pay | Admitting: Podiatry

## 2024-02-29 ENCOUNTER — Ambulatory Visit: Admitting: Podiatry

## 2024-02-29 DIAGNOSIS — M19071 Primary osteoarthritis, right ankle and foot: Secondary | ICD-10-CM | POA: Diagnosis not present

## 2024-02-29 DIAGNOSIS — M109 Gout, unspecified: Secondary | ICD-10-CM

## 2024-02-29 DIAGNOSIS — L84 Corns and callosities: Secondary | ICD-10-CM | POA: Diagnosis not present

## 2024-02-29 DIAGNOSIS — M79675 Pain in left toe(s): Secondary | ICD-10-CM

## 2024-02-29 DIAGNOSIS — B351 Tinea unguium: Secondary | ICD-10-CM | POA: Diagnosis not present

## 2024-02-29 DIAGNOSIS — M79674 Pain in right toe(s): Secondary | ICD-10-CM

## 2024-02-29 DIAGNOSIS — M79671 Pain in right foot: Secondary | ICD-10-CM | POA: Diagnosis not present

## 2024-02-29 DIAGNOSIS — M7731 Calcaneal spur, right foot: Secondary | ICD-10-CM | POA: Diagnosis not present

## 2024-02-29 DIAGNOSIS — M1A9XX1 Chronic gout, unspecified, with tophus (tophi): Secondary | ICD-10-CM

## 2024-02-29 DIAGNOSIS — E1142 Type 2 diabetes mellitus with diabetic polyneuropathy: Secondary | ICD-10-CM | POA: Diagnosis not present

## 2024-02-29 DIAGNOSIS — M2011 Hallux valgus (acquired), right foot: Secondary | ICD-10-CM | POA: Diagnosis not present

## 2024-02-29 NOTE — Telephone Encounter (Signed)
 I attempted to call the patient and ask him to come in early because we need xrays of his foot.
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
## 2024-03-01 ENCOUNTER — Ambulatory Visit (HOSPITAL_COMMUNITY)
Admission: RE | Admit: 2024-03-01 | Discharge: 2024-03-01 | Disposition: A | Discharge status: Home / Self Care | Source: Ambulatory Visit | Attending: Vascular Surgery | Admitting: Vascular Surgery

## 2024-03-01 DIAGNOSIS — I739 Peripheral vascular disease, unspecified: Secondary | ICD-10-CM | POA: Insufficient documentation

## 2024-03-02 ENCOUNTER — Other Ambulatory Visit: Payer: Self-pay | Admitting: Family Medicine

## 2024-03-06 ENCOUNTER — Ambulatory Visit: Payer: Self-pay | Admitting: Family Medicine

## 2024-03-14 NOTE — Progress Notes (Signed)
 Attempted call to patient. Left a voice mail message that I would place these results in the mail to patient.

## 2024-03-21 ENCOUNTER — Telehealth: Payer: Self-pay

## 2024-03-21 NOTE — Telephone Encounter (Signed)
 Copied from CRM #[id number]. Topic: Clinical - Prescription Issue >> Mar 20, 2024  4:17 PM [name] wrote: Reason for CRM: patient was told to inform the provider that the tadalafil  (CIALIS ) 10 MG tablet [[id number]] has not been working for 2 weeks. He tried it both ways 1 tablet daily for 10 days & 2 tablets daily for 5 days. Please advise [phone] okay to leave vm

## 2024-03-22 NOTE — Telephone Encounter (Signed)
 Attempted call to patient. Left a voice mail message requesting a return call.

## 2024-03-25 MED ORDER — SILDENAFIL CITRATE 100 MG PO TABS: 50.0000 mg | ORAL_TABLET | Freq: Every day | ORAL | 11 refills | Status: AC | PRN

## 2024-03-25 NOTE — Telephone Encounter (Signed)
 Pt returned call. He is have his phone on him. Please call him back.

## 2024-03-25 NOTE — Telephone Encounter (Signed)
 Attempted call to patient. Left a voice mail message requesting a return call.
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
## 2024-04-22 ENCOUNTER — Other Ambulatory Visit: Payer: Self-pay | Admitting: Family Medicine

## 2024-04-22 DIAGNOSIS — E1159 Type 2 diabetes mellitus with other circulatory complications: Secondary | ICD-10-CM

## 2024-04-24 ENCOUNTER — Encounter: Payer: Self-pay | Admitting: Cardiology

## 2024-04-24 ENCOUNTER — Ambulatory Visit: Admitting: Cardiology

## 2024-04-24 VITALS — BP 120/62 | HR 94 | Ht 69.0 in | Wt 215.0 lb

## 2024-04-24 DIAGNOSIS — I10 Essential (primary) hypertension: Secondary | ICD-10-CM

## 2024-04-24 DIAGNOSIS — I714 Abdominal aortic aneurysm, without rupture, unspecified: Secondary | ICD-10-CM

## 2024-04-24 DIAGNOSIS — I2510 Atherosclerotic heart disease of native coronary artery without angina pectoris: Secondary | ICD-10-CM | POA: Diagnosis not present

## 2024-04-24 DIAGNOSIS — E7800 Pure hypercholesterolemia, unspecified: Secondary | ICD-10-CM | POA: Diagnosis not present

## 2024-04-24 NOTE — Patient Instructions (Signed)

## 2024-04-27 ENCOUNTER — Other Ambulatory Visit: Payer: Self-pay | Admitting: Family Medicine

## 2024-04-30 ENCOUNTER — Other Ambulatory Visit: Payer: Self-pay | Admitting: Family Medicine

## 2024-04-30 DIAGNOSIS — E1159 Type 2 diabetes mellitus with other circulatory complications: Secondary | ICD-10-CM

## 2024-05-03 ENCOUNTER — Other Ambulatory Visit: Payer: Self-pay | Admitting: Family Medicine

## 2024-05-03 DIAGNOSIS — I10 Essential (primary) hypertension: Secondary | ICD-10-CM

## 2024-05-03 DIAGNOSIS — M1A00X0 Idiopathic chronic gout, unspecified site, without tophus (tophi): Secondary | ICD-10-CM

## 2024-05-30 ENCOUNTER — Encounter: Payer: Self-pay | Admitting: Podiatry

## 2024-05-30 ENCOUNTER — Ambulatory Visit: Admitting: Podiatry

## 2024-05-30 DIAGNOSIS — B351 Tinea unguium: Secondary | ICD-10-CM

## 2024-05-30 DIAGNOSIS — L84 Corns and callosities: Secondary | ICD-10-CM

## 2024-05-30 DIAGNOSIS — M79674 Pain in right toe(s): Secondary | ICD-10-CM

## 2024-05-30 DIAGNOSIS — M79675 Pain in left toe(s): Secondary | ICD-10-CM | POA: Diagnosis not present

## 2024-05-30 DIAGNOSIS — E1142 Type 2 diabetes mellitus with diabetic polyneuropathy: Secondary | ICD-10-CM | POA: Diagnosis not present
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
## 2024-06-09 ENCOUNTER — Other Ambulatory Visit: Payer: Self-pay | Admitting: Family Medicine

## 2024-06-09 DIAGNOSIS — E1159 Type 2 diabetes mellitus with other circulatory complications: Secondary | ICD-10-CM

## 2024-06-19 ENCOUNTER — Encounter: Payer: Self-pay | Admitting: Family Medicine

## 2024-06-19 ENCOUNTER — Ambulatory Visit: Admitting: Family Medicine

## 2024-06-19 VITALS — BP 123/60 | HR 75 | Ht 69.0 in | Wt 213.0 lb

## 2024-06-19 DIAGNOSIS — E291 Testicular hypofunction: Secondary | ICD-10-CM

## 2024-06-19 DIAGNOSIS — E1142 Type 2 diabetes mellitus with diabetic polyneuropathy: Secondary | ICD-10-CM

## 2024-06-19 DIAGNOSIS — Z794 Long term (current) use of insulin: Secondary | ICD-10-CM | POA: Diagnosis not present

## 2024-06-19 DIAGNOSIS — Z951 Presence of aortocoronary bypass graft: Secondary | ICD-10-CM

## 2024-06-19 DIAGNOSIS — E1159 Type 2 diabetes mellitus with other circulatory complications: Secondary | ICD-10-CM

## 2024-06-19 DIAGNOSIS — M792 Neuralgia and neuritis, unspecified: Secondary | ICD-10-CM

## 2024-06-19 DIAGNOSIS — I10 Essential (primary) hypertension: Secondary | ICD-10-CM

## 2024-06-19 LAB — POCT GLYCOSYLATED HEMOGLOBIN (HGB A1C): HbA1c, POC (controlled diabetic range): 7.8 % — AB (ref 0.0–7.0)

## 2024-06-19 MED ORDER — ANDRODERM 2 MG/24HR TD PT24: MEDICATED_PATCH | TRANSDERMAL | 1 refills | Status: DC

## 2024-06-19 MED ORDER — ZEPBOUND 5 MG/0.5ML ~~LOC~~ SOAJ: 5.0000 mg | SUBCUTANEOUS | 3 refills | Status: DC

## 2024-06-19 MED ORDER — ZEPBOUND 2.5 MG/0.5ML ~~LOC~~ SOAJ: 2.5000 mg | SUBCUTANEOUS | 0 refills | Status: DC

## 2024-06-19 MED ORDER — CELECOXIB 200 MG PO CAPS: ORAL_CAPSULE | ORAL | 1 refills | Status: DC
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
## 2024-06-19 NOTE — Assessment & Plan Note (Signed)
BP is reasonably well controlled.  Continue amlodipine at current strength.

## 2024-06-19 NOTE — Assessment & Plan Note (Signed)
 Continue with lyrica  at current strength.

## 2024-06-19 NOTE — Patient Instructions (Addendum)
 Start zepbound  2.5mg  weekly x4 weeks then increase to 5mg  weekly.    When you increase to 5mg  decrease your insulin  by 20 units.

## 2024-06-19 NOTE — Assessment & Plan Note (Signed)
 Continue testosterone .  Will try to get this filled at a different pharmacy due to supply issues at his regular pharmacy.

## 2024-06-19 NOTE — Assessment & Plan Note (Signed)
 A1c has worsened since last visit.  He will work on dietary changes as well as increasing activity level.  Adding mounjaro.  Anticipate needing to reduce his insulin  and given instructions on how to do so as he titrates mounjaro. Follow-up in 3 to 4 months.

## 2024-06-19 NOTE — Telephone Encounter (Unsigned)
 Copied from CRM [id number]. Topic: General - Other >> Jun 19, 2024  3:10 PM Victoria B wrote: Reason for CRM: pharmacy called in needs to know patients dx code, trying to fill zepbound 

## 2024-06-19 NOTE — Assessment & Plan Note (Signed)
Management per cardiology.  Continue asa and statin.

## 2024-06-28 ENCOUNTER — Other Ambulatory Visit: Payer: Self-pay | Admitting: Family Medicine

## 2024-06-28 ENCOUNTER — Telehealth: Payer: Self-pay

## 2024-06-28 MED ORDER — CELECOXIB 200 MG PO CAPS: 200.0000 mg | ORAL_CAPSULE | Freq: Two times a day (BID) | ORAL | 1 refills | Status: AC

## 2024-06-28 NOTE — Telephone Encounter (Signed)
 Patient is aware. Voiced his understanding

## 2024-06-28 NOTE — Telephone Encounter (Signed)
 Copied from CRM [id number]. Topic: Clinical - Prescription Issue >> Jun 28, 2024  9:48 AM Myrick T wrote: Reason for CRM: patient called stated the script for celecoxib  (CELEBREX ) 200 MG capsule should for 2 tablets per day 90 day supply. Please advise.

## 2024-07-10 ENCOUNTER — Telehealth: Payer: Self-pay

## 2024-07-10 ENCOUNTER — Other Ambulatory Visit: Payer: Self-pay | Admitting: Family Medicine

## 2024-07-10 ENCOUNTER — Ambulatory Visit (INDEPENDENT_AMBULATORY_CARE_PROVIDER_SITE_OTHER)

## 2024-07-10 ENCOUNTER — Other Ambulatory Visit (HOSPITAL_COMMUNITY): Payer: Self-pay

## 2024-07-10 VITALS — BP 133/67 | HR 94 | Ht 69.0 in | Wt 217.0 lb

## 2024-07-10 DIAGNOSIS — Z0000 Encounter for general adult medical examination without abnormal findings: Secondary | ICD-10-CM

## 2024-07-10 MED ORDER — TIRZEPATIDE 5 MG/0.5ML ~~LOC~~ SOAJ: 5.0000 mg | SUBCUTANEOUS | 0 refills | Status: AC

## 2024-07-10 MED ORDER — MOUNJARO 2.5 MG/0.5ML ~~LOC~~ SOAJ: 2.5000 mg | SUBCUTANEOUS | 0 refills | Status: AC

## 2024-07-10 NOTE — Patient Instructions (Signed)
 [name],  Thank you for taking the time for your Medicare Wellness Visit. I appreciate your continued commitment to your health goals. Please review the care plan we discussed, and feel free to reach out if I can assist you further.  Please note that Annual Wellness Visits do not include a physical exam. Some assessments may be limited, especially if the visit was conducted virtually. If needed, we may recommend an in-person follow-up with your provider.  Ongoing Care Seeing your primary care provider every 3 to 6 months helps us  monitor your health and provide consistent, personalized care.   Referrals If a referral was made during today's visit and you haven't received any updates within two weeks, please contact the referred provider directly to check on the status.  Recommended Screenings:  Health Maintenance  Topic Date Due   Eye exam for diabetics  06/29/2022   Medicare Annual Wellness Visit  07/09/2024   Flu Shot  09/10/2024*   DTaP/Tdap/Td vaccine (1 - Tdap) 02/13/2025*   Colon Cancer Screening  02/13/2025*   COVID-19 Vaccine (6 - 2025-26 season) 03/01/2025*   Yearly kidney function blood test for diabetes  08/13/2024   Kidney health urinalysis for diabetes  08/13/2024   Hemoglobin A1C  12/17/2024   Complete foot exam   06/19/2025   Hepatitis C Screening  Completed   Meningitis B Vaccine  Aged Out   Screening for Lung Cancer  Discontinued   Pneumococcal Vaccine for age over 68  Discontinued   Zoster (Shingles) Vaccine  Discontinued  *Topic was postponed. The date shown is not the original due date.       07/10/2024    9:05 AM  Advanced Directives  Does Patient Have a Medical Advance Directive? Yes  Type of Advance Directive Living will  Does patient want to make changes to medical advance directive? No - Patient declined    Vision: Annual vision screenings are recommended for early detection of glaucoma, cataracts, and diabetic retinopathy. These exams can also  reveal signs of chronic conditions such as diabetes and high blood pressure.  Dental: Annual dental screenings help detect early signs of oral cancer, gum disease, and other conditions linked to overall health, including heart disease and diabetes.  Please see the attached documents for additional preventive care recommendations.

## 2024-07-10 NOTE — Telephone Encounter (Signed)
 Spoke with pharmacy, product NDC does not exist anymore. Testosterone  patches are no longer available for order. Can this be changed to another formulary? (Inj, gel, pump, etc)

## 2024-07-10 NOTE — Telephone Encounter (Signed)
 Left message for a return call
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
## 2024-07-10 NOTE — Telephone Encounter (Signed)
 Spoke with [facility] pharmacy and was told that Mounjaro  did go through insurance without a PA needed but co-pay is $514 likely due to needing to meet deductible for new year  Attempted to contact patient. Left a voicemail message requesting a return call.

## 2024-07-10 NOTE — Telephone Encounter (Signed)
 [name] states he is still waiting on Mounjaro  prescription. There is a Zepbound  prescription on file but not Mounjaro .   He is also requesting Testosterone .   These may need a prior authorization.

## 2024-07-11 ENCOUNTER — Telehealth: Payer: Self-pay

## 2024-07-11 NOTE — Telephone Encounter (Signed)
 See other message

## 2024-07-11 NOTE — Telephone Encounter (Signed)
 The patient called in returning a call to [facility]. I hollace Slough and she will call the patient back. I let him know and he was appreciative.

## 2024-07-11 NOTE — Telephone Encounter (Signed)
 Copied from CRM #[id number]. Topic: General - Other >> Jul 11, 2024 10:46 AM Sophia H wrote: Reason for CRM: Patient states he had a missed call from Luke GLENWOOD Sermon with [employer] and was advised Luke did reach out but patient has since spoke with Jon who has already relayed the message.  Patient states Jon & Luke left a voicemail yesterday but he hasn't talked to either one since - stated just reach out if you need him. TY

## 2024-07-12 MED ORDER — TESTOSTERONE 20.25 MG/ACT (1.62%) TD GEL: TRANSDERMAL | 3 refills | Status: AC

## 2024-07-12 NOTE — Telephone Encounter (Signed)
 Left message advising of the testosterone .

## 2024-08-26 ENCOUNTER — Ambulatory Visit: Admitting: Podiatry

## 2024-09-17 ENCOUNTER — Ambulatory Visit: Admitting: Family Medicine

## 2025-07-15 ENCOUNTER — Ambulatory Visit
